# Patient Record
Sex: Female | Born: 1965 | Race: White | Hispanic: No | Marital: Married | Smoking: Current every day smoker
Health system: Southern US, Community
[De-identification: ages and names within clinical notes are randomized; demographics above are authoritative.]

## PROBLEM LIST (undated history)

## (undated) DIAGNOSIS — J9621 Acute and chronic respiratory failure with hypoxia: Secondary | ICD-10-CM

## (undated) DIAGNOSIS — K219 Gastro-esophageal reflux disease without esophagitis: Secondary | ICD-10-CM

## (undated) DIAGNOSIS — J449 Chronic obstructive pulmonary disease, unspecified: Secondary | ICD-10-CM

## (undated) DIAGNOSIS — I639 Cerebral infarction, unspecified: Secondary | ICD-10-CM

## (undated) HISTORY — DX: Chronic obstructive pulmonary disease, unspecified: J44.9

## (undated) HISTORY — DX: Gastro-esophageal reflux disease without esophagitis: K21.9

## (undated) HISTORY — PX: BREAST SURGERY: SHX581

---

## 2012-11-22 ENCOUNTER — Encounter: Payer: Self-pay | Admitting: Sports Medicine

## 2012-11-22 ENCOUNTER — Ambulatory Visit (INDEPENDENT_AMBULATORY_CARE_PROVIDER_SITE_OTHER): Payer: Self-pay | Admitting: Sports Medicine

## 2012-11-22 VITALS — BP 130/85 | HR 111 | Wt 315.0 lb

## 2012-11-22 DIAGNOSIS — Z299 Encounter for prophylactic measures, unspecified: Secondary | ICD-10-CM | POA: Insufficient documentation

## 2012-11-22 DIAGNOSIS — I878 Other specified disorders of veins: Secondary | ICD-10-CM | POA: Insufficient documentation

## 2012-11-22 DIAGNOSIS — E669 Obesity, unspecified: Secondary | ICD-10-CM | POA: Insufficient documentation

## 2012-11-22 DIAGNOSIS — R32 Unspecified urinary incontinence: Secondary | ICD-10-CM

## 2012-11-22 DIAGNOSIS — F419 Anxiety disorder, unspecified: Secondary | ICD-10-CM | POA: Insufficient documentation

## 2012-11-22 DIAGNOSIS — J4489 Other specified chronic obstructive pulmonary disease: Secondary | ICD-10-CM

## 2012-11-22 DIAGNOSIS — I872 Venous insufficiency (chronic) (peripheral): Secondary | ICD-10-CM

## 2012-11-22 DIAGNOSIS — F411 Generalized anxiety disorder: Secondary | ICD-10-CM

## 2012-11-22 DIAGNOSIS — J449 Chronic obstructive pulmonary disease, unspecified: Secondary | ICD-10-CM | POA: Insufficient documentation

## 2012-11-22 DIAGNOSIS — F17200 Nicotine dependence, unspecified, uncomplicated: Secondary | ICD-10-CM

## 2012-11-22 MED ORDER — LORAZEPAM 0.5 MG PO TABS: 0.5000 mg | ORAL_TABLET | Freq: Three times a day (TID) | ORAL | Status: DC | PRN

## 2012-11-22 MED ORDER — CITALOPRAM HYDROBROMIDE 20 MG PO TABS: 20.0000 mg | ORAL_TABLET | Freq: Every day | ORAL | Status: DC

## 2012-11-22 MED ORDER — AMBULATORY NON FORMULARY MEDICATION: Status: DC

## 2012-11-22 NOTE — Assessment & Plan Note (Signed)
We will start citalopram, and continue Ativan for breakthrough anxiety episodes. I will see her back in 2 weeks. GAD 7 and PHQ9 before visit.

## 2012-11-22 NOTE — Assessment & Plan Note (Signed)
Up-to-date on Pap smear. We will need to immunize with Tdap and flu at next visit.

## 2012-11-22 NOTE — Assessment & Plan Note (Signed)
We will follow this up at a future visit. 

## 2012-11-22 NOTE — Assessment & Plan Note (Signed)
We will follow this up at a future visit.

## 2012-11-22 NOTE — Progress Notes (Addendum)
Subjective:    CC: Establish care.   HPI:  Anxiety: Wakes up often in pack, notes little interest or pleasure in doing things, difficulty sleeping, poor energy, change in appetite, and trouble concentrating. Denies suicidal or homicidal ideation. Also notes feeling nervous, excessive worry, trouble relaxing, restlessness, irritability, and fear. Has been on Zoloft and Ativan in the past with good response.  Lower extremity swelling: Is present in both extremities for years, painful, pain is localized to entirety of both lower extremities, no radiation, moderate in severity, swollen, no long car rides, surgery, or other DVT risks. No shortness of breath.  COPD: Stable, uses inhalers. No changes.  Urinary incontinence: Stable, amenable to discussing this at a future visit.  Smoker: Smokes 2 packs a day.  Preventive measure: Last Pap was in July of 2013. She has an OB/GYN.  Past medical history, Surgical history, Family history, Social history, Allergies, and medications have been entered into the medical record, reviewed, and no changes needed.   Review of Systems: No headache, visual changes, nausea, vomiting, diarrhea, constipation, dizziness, abdominal pain, skin rash, fevers, chills, night sweats, swollen lymph nodes, weight loss, chest pain, body aches, joint swelling, muscle aches, shortness of breath, mood changes, visual or auditory hallucinations.  Objective:    General: Well Developed, obese, and in no acute distress.  Neuro: Alert and oriented x3, extra-ocular muscles intact.  HEENT: Normocephalic, atraumatic, pupils equal round reactive to light, neck supple, no masses, no lymphadenopathy, thyroid nonpalpable.  Skin: Warm and dry, no rashes noted.  Cardiac: Regular rate and rhythm, no murmurs rubs or gallops.  Respiratory: Clear to auscultation bilaterally. Not using accessory muscles, speaking in full sentences.  Abdominal: Soft, nontender, nondistended, positive bowel sounds,  no masses, no organomegaly.  Musculoskeletal: Shoulder, elbow, wrist, hip, knee, ankle stable, and with full range of motion. Both legs show signs of venous stasis including hemosiderin deposits. Negative Homans sign bilaterally.  Impression and Recommendations:    The patient was counselled, risk factors were discussed, anticipatory guidance given.

## 2012-11-22 NOTE — Assessment & Plan Note (Signed)
We will try graduated compression stockings. I will put her out a prescription, and she will take this to her nearest medical supply store.

## 2012-12-01 ENCOUNTER — Other Ambulatory Visit: Payer: Self-pay | Admitting: *Deleted

## 2012-12-01 MED ORDER — ESOMEPRAZOLE MAGNESIUM 40 MG PO CPDR: 40.0000 mg | DELAYED_RELEASE_CAPSULE | Freq: Every day | ORAL | Status: AC

## 2012-12-02 ENCOUNTER — Encounter: Payer: Self-pay | Admitting: Sports Medicine

## 2012-12-02 DIAGNOSIS — K219 Gastro-esophageal reflux disease without esophagitis: Secondary | ICD-10-CM | POA: Insufficient documentation

## 2012-12-08 ENCOUNTER — Ambulatory Visit: Payer: 59 | Admitting: Sports Medicine

## 2012-12-13 ENCOUNTER — Other Ambulatory Visit: Payer: Self-pay | Admitting: *Deleted

## 2012-12-13 NOTE — Telephone Encounter (Signed)
Kristin Sampson was supposed to come back and see me in 2 weeks to see how she was doing on the new medicine. It has been 3 weeks. Have her come back to see me and we can refill it at that time.

## 2012-12-13 NOTE — Telephone Encounter (Signed)
Pt calls & wants to know if you will refill her ativan prescription?

## 2012-12-14 NOTE — Telephone Encounter (Signed)
Pt.notified

## 2012-12-16 ENCOUNTER — Ambulatory Visit: Payer: 59 | Admitting: Sports Medicine

## 2013-01-23 ENCOUNTER — Ambulatory Visit (INDEPENDENT_AMBULATORY_CARE_PROVIDER_SITE_OTHER): Payer: 59 | Admitting: Sports Medicine

## 2013-01-23 ENCOUNTER — Ambulatory Visit: Payer: 59 | Admitting: Sports Medicine

## 2013-01-23 ENCOUNTER — Encounter: Payer: Self-pay | Admitting: Sports Medicine

## 2013-01-23 VITALS — BP 137/92 | HR 95 | Wt 304.0 lb

## 2013-01-23 DIAGNOSIS — R0601 Orthopnea: Secondary | ICD-10-CM | POA: Insufficient documentation

## 2013-01-23 DIAGNOSIS — G4733 Obstructive sleep apnea (adult) (pediatric): Secondary | ICD-10-CM

## 2013-01-23 DIAGNOSIS — J4489 Other specified chronic obstructive pulmonary disease: Secondary | ICD-10-CM

## 2013-01-23 DIAGNOSIS — F17200 Nicotine dependence, unspecified, uncomplicated: Secondary | ICD-10-CM

## 2013-01-23 DIAGNOSIS — J449 Chronic obstructive pulmonary disease, unspecified: Secondary | ICD-10-CM

## 2013-01-23 MED ORDER — TIOTROPIUM BROMIDE MONOHYDRATE 18 MCG IN CAPS: 18.0000 ug | ORAL_CAPSULE | Freq: Every day | RESPIRATORY_TRACT | Status: AC

## 2013-01-23 NOTE — Assessment & Plan Note (Signed)
Precontemplative phase, will revisit in the future.

## 2013-01-23 NOTE — Assessment & Plan Note (Signed)
With paroxysmal nocturnal dyspnea. I'm going to obtain an echo.

## 2013-01-23 NOTE — Assessment & Plan Note (Addendum)
Currently using Symbicort. I'm going to add Spiriva. Smoking cessation emphasized.

## 2013-01-23 NOTE — Progress Notes (Signed)
Subjective:    CC: Followup  HPI: COPD: Diagnosed years ago with spirometry, and a chest x-ray. She was started on Symbicort, and breathing has been pretty good without exacerbations. Unfortunately she still notes some shortness of breath is wondering what else can be done. She does still smoke, but notes that she is not yet ready to quit. She does note difficulty particularly at night, and does have some orthopnea and paroxysmal nocturnal dyspnea. She also has mild swelling in her lower extremities.  Past medical history, Surgical history, Family history not pertinant except as noted below, Social history, Allergies, and medications have been entered into the medical record, reviewed, and no changes needed.   Review of Systems: No fevers, chills, night sweats, weight loss, chest pain, or shortness of breath.   Objective:    General: Well Developed, well nourished, and in no acute distress.  Neuro: Alert and oriented x3, extra-ocular muscles intact, sensation grossly intact.  HEENT: Normocephalic, atraumatic, pupils equal round reactive to light, neck supple, no masses, no lymphadenopathy, thyroid nonpalpable.  Skin: Warm and dry, no rashes. Cardiac: Regular rate and rhythm, no murmurs rubs or gallops. 1+ pitting edema in both lower extremities, venous stasis is also present by the way. Respiratory: Clear to auscultation bilaterally. Not using accessory muscles, speaking in full sentences. Impression and Recommendations:

## 2013-02-09 ENCOUNTER — Ambulatory Visit (HOSPITAL_COMMUNITY): Payer: 59 | Attending: Sports Medicine | Admitting: Radiology

## 2013-02-09 ENCOUNTER — Other Ambulatory Visit (HOSPITAL_COMMUNITY): Payer: Self-pay | Admitting: Sports Medicine

## 2013-02-09 ENCOUNTER — Other Ambulatory Visit (HOSPITAL_COMMUNITY): Payer: Self-pay | Admitting: Radiology

## 2013-02-09 ENCOUNTER — Other Ambulatory Visit: Payer: Self-pay

## 2013-02-09 DIAGNOSIS — J449 Chronic obstructive pulmonary disease, unspecified: Secondary | ICD-10-CM | POA: Insufficient documentation

## 2013-02-09 DIAGNOSIS — F17200 Nicotine dependence, unspecified, uncomplicated: Secondary | ICD-10-CM | POA: Insufficient documentation

## 2013-02-09 DIAGNOSIS — R0601 Orthopnea: Secondary | ICD-10-CM

## 2013-02-09 DIAGNOSIS — G4733 Obstructive sleep apnea (adult) (pediatric): Secondary | ICD-10-CM | POA: Insufficient documentation

## 2013-02-09 DIAGNOSIS — J4489 Other specified chronic obstructive pulmonary disease: Secondary | ICD-10-CM | POA: Insufficient documentation

## 2013-02-09 NOTE — Progress Notes (Signed)
Echocardiogram performed.  

## 2013-03-27 ENCOUNTER — Ambulatory Visit: Payer: 59 | Admitting: Sports Medicine

## 2013-03-29 ENCOUNTER — Ambulatory Visit (INDEPENDENT_AMBULATORY_CARE_PROVIDER_SITE_OTHER): Payer: 59 | Admitting: Sports Medicine

## 2013-03-29 ENCOUNTER — Encounter: Payer: Self-pay | Admitting: Sports Medicine

## 2013-03-29 ENCOUNTER — Other Ambulatory Visit: Payer: Self-pay | Admitting: Sports Medicine

## 2013-03-29 VITALS — BP 139/87 | HR 96 | Wt 293.0 lb

## 2013-03-29 DIAGNOSIS — J4489 Other specified chronic obstructive pulmonary disease: Secondary | ICD-10-CM

## 2013-03-29 DIAGNOSIS — J449 Chronic obstructive pulmonary disease, unspecified: Secondary | ICD-10-CM

## 2013-03-29 DIAGNOSIS — R252 Cramp and spasm: Secondary | ICD-10-CM

## 2013-03-29 DIAGNOSIS — F17200 Nicotine dependence, unspecified, uncomplicated: Secondary | ICD-10-CM

## 2013-03-29 DIAGNOSIS — Z299 Encounter for prophylactic measures, unspecified: Secondary | ICD-10-CM

## 2013-03-29 DIAGNOSIS — Z0289 Encounter for other administrative examinations: Secondary | ICD-10-CM

## 2013-03-29 MED ORDER — BUPROPION HCL ER (XL) 150 MG PO TB24: 150.0000 mg | ORAL_TABLET | ORAL | Status: AC

## 2013-03-29 MED ORDER — MAGNESIUM OXIDE 400 MG PO TABS: 800.0000 mg | ORAL_TABLET | Freq: Every day | ORAL | Status: AC

## 2013-03-29 NOTE — Progress Notes (Signed)
  Subjective:    CC: Followup  HPI: Smoker: Naika is now ready to try quitting. She does not want to try Chantix.  COPD: Stable, breathing is still not where she wants it to be but she understands she needs to quit smoking. No issues with Symbicort and Spiriva.  Muscle cramps: Present in the hands and the legs, present for a few weeks now, no changes in activity, medications, diet. No back pain. Worse at night. No numbness or tingling, or pain in the neck.  Past medical history, Surgical history, Family history not pertinant except as noted below, Social history, Allergies, and medications have been entered into the medical record, reviewed, and no changes needed.   Review of Systems: No fevers, chills, night sweats, weight loss, chest pain, or shortness of breath.   Objective:    General: Well Developed, well nourished, and in no acute distress.  Neuro: Alert and oriented x3, extra-ocular muscles intact, sensation grossly intact.  HEENT: Normocephalic, atraumatic, pupils equal round reactive to light, neck supple, no masses, no lymphadenopathy, thyroid nonpalpable.  Skin: Warm and dry, no rashes. Cardiac: Regular rate and rhythm, no murmurs rubs or gallops, no lower extremity edema.  Respiratory: Clear to auscultation bilaterally. Not using accessory muscles, speaking in full sentences. Extremities: Unremarkable to inspection, unremarkable to palpation, neurovascularly intact distally, full range of motion throughout.  Impression and Recommendations:

## 2013-03-29 NOTE — Assessment & Plan Note (Signed)
Symptoms are not bad with Symbicort and Spiriva. We again discussed smoking cessation.

## 2013-03-29 NOTE — Assessment & Plan Note (Signed)
In both hands and both feet. Checking electrolytes, adding magnesium 800 mg each bedtime.

## 2013-03-29 NOTE — Assessment & Plan Note (Signed)
Checking routine bloodwork. 

## 2013-03-29 NOTE — Assessment & Plan Note (Signed)
Discussed smoking cessation again. She is now ready to quit. She declines Chantix, we will try bupropion. She is also willing to try to vapor cigarette.

## 2013-03-29 NOTE — Progress Notes (Signed)
Disability forms filled out, charge sheet entered.

## 2013-03-30 ENCOUNTER — Ambulatory Visit: Payer: 59 | Admitting: Sports Medicine

## 2013-03-31 ENCOUNTER — Telehealth: Payer: Self-pay | Admitting: *Deleted

## 2013-03-31 NOTE — Telephone Encounter (Signed)
Pt has LM that the Magnesium is not working and that you told her that if it didn't help to call back. Please advise.

## 2013-03-31 NOTE — Telephone Encounter (Signed)
Pt says no thanks that she has taken vistaril before and it didn't work.

## 2013-03-31 NOTE — Telephone Encounter (Signed)
I would be happy to talk to her in an office visit to discuss possible causes and remedies for insomnia.

## 2013-03-31 NOTE — Telephone Encounter (Signed)
LM on VM for pt to return call. 

## 2013-03-31 NOTE — Telephone Encounter (Signed)
I am happy to do Vistaril which is just as effective but not habit-forming.

## 2013-03-31 NOTE — Telephone Encounter (Signed)
Increase magnesium to 1200 mg at night, double water consumption.

## 2013-03-31 NOTE — Telephone Encounter (Signed)
Informed pt about your response to the magnesium. She is asking now if you will call her in a prescription for Ambien. She states she is not sleeping at night.

## 2013-04-03 NOTE — Telephone Encounter (Signed)
Informed pt and she states she doesn't want to set up an appt at this time.

## 2013-04-07 ENCOUNTER — Ambulatory Visit: Payer: 59 | Admitting: Sports Medicine

## 2013-04-10 ENCOUNTER — Encounter: Payer: Self-pay | Admitting: Sports Medicine

## 2013-04-10 ENCOUNTER — Ambulatory Visit (INDEPENDENT_AMBULATORY_CARE_PROVIDER_SITE_OTHER): Payer: 59 | Admitting: Sports Medicine

## 2013-04-10 VITALS — BP 153/90 | HR 108 | Wt 289.0 lb

## 2013-04-10 DIAGNOSIS — L600 Ingrowing nail: Secondary | ICD-10-CM | POA: Insufficient documentation

## 2013-04-10 DIAGNOSIS — B353 Tinea pedis: Secondary | ICD-10-CM

## 2013-04-10 DIAGNOSIS — R252 Cramp and spasm: Secondary | ICD-10-CM

## 2013-04-10 MED ORDER — CYCLOBENZAPRINE HCL 10 MG PO TABS: ORAL_TABLET | ORAL | Status: DC

## 2013-04-10 MED ORDER — METHOCARBAMOL 500 MG PO TABS: 500.0000 mg | ORAL_TABLET | Freq: Three times a day (TID) | ORAL | Status: AC

## 2013-04-10 MED ORDER — TERBINAFINE HCL 250 MG PO TABS: 250.0000 mg | ORAL_TABLET | Freq: Every day | ORAL | Status: AC

## 2013-04-10 NOTE — Assessment & Plan Note (Signed)
Patient will schedule excision.

## 2013-04-10 NOTE — Assessment & Plan Note (Addendum)
Continue magnesium at bedtime. Adding Robaxin. She did ask for soma, which was the denied.

## 2013-04-10 NOTE — Progress Notes (Signed)
  Subjective:    CC: Foot pain  HPI: Kristin Sampson comes back with complaints of bilateral foot pain. This is present chronically, and she has ingrown toenails on both sides of the great toes, as well as cracked heels. Pain is localized, does not radiate, mild to moderate. Stable.   Past medical history, Surgical history, Family history not pertinant except as noted below, Social history, Allergies, and medications have been entered into the medical record, reviewed, and no changes needed.   Review of Systems: No fevers, chills, night sweats, weight loss, chest pain, or shortness of breath.   Objective:    General: Well Developed, well nourished, and in no acute distress.  Neuro: Alert and oriented x3, extra-ocular muscles intact, sensation grossly intact.  HEENT: Normocephalic, atraumatic, pupils equal round reactive to light, neck supple, no masses, no lymphadenopathy, thyroid nonpalpable.  Skin: Warm and dry, no rashes.Heels are cracked and scaly, both toenails on her great toes are ingrown.  Cardiac: Regular rate and rhythm, no murmurs rubs or gallops, no lower extremity edema.  Respiratory: Clear to auscultation bilaterally. Not using accessory muscles, speaking in full sentences. Impression and Recommendations:

## 2013-04-10 NOTE — Assessment & Plan Note (Signed)
Lamisil for 4 weeks.

## 2013-04-13 ENCOUNTER — Ambulatory Visit (INDEPENDENT_AMBULATORY_CARE_PROVIDER_SITE_OTHER): Payer: 59 | Admitting: Sports Medicine

## 2013-04-13 DIAGNOSIS — L600 Ingrowing nail: Secondary | ICD-10-CM

## 2013-04-13 MED ORDER — OXYCODONE-ACETAMINOPHEN 5-325 MG PO TABS: 1.0000 | ORAL_TABLET | Freq: Three times a day (TID) | ORAL | Status: DC | PRN

## 2013-04-13 NOTE — Assessment & Plan Note (Signed)
Left and right great toenails were removed today under digital block. Oxycodone. Return in 1 weeks.

## 2013-04-13 NOTE — Progress Notes (Signed)
   Procedure:  Removal of left and right great toenails due to onychomycosis and onycholysis.  Risks, benefits, alternatives explained to patient. Consent obtained. Time out conducted. Noted no overlying induration or erythema at site of injection. Toe cleaned with alcohol, then a total of 7 cc lidocaine 2% infiltrated at adjacent webspaces at the location of the bifurcation of the common digital nerve to proper digital nerves, as well as over the dorsum of the great toe.  Some lidocaine also infiltrated at hyponychium and under nail bed.  Adequate anesthesia ensured. Toe prepped and draped in a sterile fashion. Nail elevator used to separate nail plate from nail bed. Clippers used to cut toenail in a longitudinal fashion to proximal nail fold and matrix. Hemostat then used to separate nail fragment from surrounding structures. Nail bed and matrix treated. Minor bleeding controlled with pressure and silver nitrate. Antibiotic ointment applied. Toe dressed. Advised to return if increased redness, swelling, drainage, fevers, or chills The procedure was repeated on the contralateral side.

## 2013-04-13 NOTE — Patient Instructions (Addendum)
Toenail Removal   Toenails may need to be removed because of injury, infections, or to correct abnormal growth. A special non-stick bandage will likely be put tightly on your toe to prevent bleeding. Often times a new nail will grow back. Sometimes the new nail may be deformed. Most of the time when a nail is lost, it will gradually heal, but may be sensitive for a long time.   HOME CARE INSTRUCTIONS   Keep your foot elevated to relieve pain and swelling. This will require lying in bed or on a couch with the leg on pillows or sitting in a recliner with the leg up. Walking or letting your leg dangle may increase swelling, slow healing, and cause throbbing pain.   Keep your bandage dry and clean.   Change your bandage in 24 hours.   After your bandage is changed, soak your foot in warm, soapy water for 10 to 20 minutes. Do this 3 times per day. This helps reduce pain and swelling. After soaking your foot apply a clean, dry bandage. Change your bandage if it is wet or dirty.   Only take over-the-counter or prescription medicines for pain, discomfort, or fever as directed by your caregiver.   See your caregiver as needed for problems.  You might need a tetanus shot now if:   You have no idea when you had the last one.   You have never had a tetanus shot before.   The injured area had dirt in it.  If you need a tetanus shot, and you decide not to get one, there is a rare chance of getting tetanus. Sickness from tetanus can be serious. If you did get a tetanus shot, your arm may swell, get red and warm to the touch at the shot site. This is common and not a problem.   SEEK IMMEDIATE MEDICAL CARE IF:   You have increased pain, swelling, redness, warmth, drainage, or bleeding.   You have a fever.   You have swelling that spreads from your toe into your foot.  Document Released: 08/08/2003 Document Revised: 02/01/2012 Document Reviewed: 11/19/2008   [employer]® Patient Information ©2014 [employer].

## 2013-04-19 ENCOUNTER — Telehealth: Payer: Self-pay | Admitting: Sports Medicine

## 2013-04-19 NOTE — Telephone Encounter (Signed)
Patient called and stated she request a call back about some concerns and questions she has. Thanks [phone]

## 2013-04-19 NOTE — Telephone Encounter (Signed)
Noted, will see her tomorrow.

## 2013-04-19 NOTE — Telephone Encounter (Signed)
Pt states that her toe is infected from the toenail removal. Informed pt to schedule appt for Thursday. She was also asking if Dr. Benjamin Stain would refill her pain med. Pt transferred to front to schedule appt.  Meyer Cory, LPN

## 2013-04-20 ENCOUNTER — Ambulatory Visit (INDEPENDENT_AMBULATORY_CARE_PROVIDER_SITE_OTHER): Payer: 59 | Admitting: Sports Medicine

## 2013-04-20 ENCOUNTER — Encounter: Payer: Self-pay | Admitting: Sports Medicine

## 2013-04-20 VITALS — BP 121/75 | HR 102 | Temp 97.6°F | Wt 291.0 lb

## 2013-04-20 DIAGNOSIS — L600 Ingrowing nail: Secondary | ICD-10-CM

## 2013-04-20 MED ORDER — KETOROLAC TROMETHAMINE 10 MG PO TABS: 10.0000 mg | ORAL_TABLET | Freq: Four times a day (QID) | ORAL | Status: AC | PRN

## 2013-04-20 MED ORDER — KETOROLAC TROMETHAMINE 60 MG/2ML IM SOLN
60.0000 mg | Freq: Once | INTRAMUSCULAR | Status: AC
  Administered 2013-04-20: 60 mg via INTRAMUSCULAR

## 2013-04-20 MED ORDER — CEPHALEXIN 500 MG PO CAPS: 500.0000 mg | ORAL_CAPSULE | Freq: Four times a day (QID) | ORAL | Status: AC

## 2013-04-20 NOTE — Assessment & Plan Note (Signed)
Looks infected. Adding cephalexin. Toradol 60mg  IM. Toradol 10mg  TID x 5 days.

## 2013-04-20 NOTE — Progress Notes (Signed)
  Subjective:    CC: Postop  HPI: I removed Kristin Sampson's great toenails one week ago on both sides. Unfortunately she's having increasing pain with some mild drainage, but no constitutional symptoms. She change the dressings daily if she can.  Past medical history, Surgical history, Family history not pertinant except as noted below, Social history, Allergies, and medications have been entered into the medical record, reviewed, and no changes needed.   Review of Systems: No fevers, chills, night sweats, weight loss, chest pain, or shortness of breath.   Objective:    General: Well Developed, well nourished, and in no acute distress.  Feet bilateral: There is some erythema of the toes bilaterally as well as a purulent, adherent, exudate over the nailbed.  She is neurovascularly intact distally, but is exquisitely painful.  Toradol 60 mg intramuscular given in office.  Impression and Recommendations:

## 2013-04-25 ENCOUNTER — Ambulatory Visit: Payer: 59 | Admitting: Sports Medicine

## 2013-04-25 DIAGNOSIS — Z0289 Encounter for other administrative examinations: Secondary | ICD-10-CM

## 2013-04-26 ENCOUNTER — Ambulatory Visit: Payer: 59 | Admitting: Sports Medicine

## 2020-08-26 ENCOUNTER — Other Ambulatory Visit (HOSPITAL_COMMUNITY): Payer: Medicare HMO

## 2020-08-26 ENCOUNTER — Inpatient Hospital Stay
Admit: 2020-08-26 | Discharge: 2020-09-12 | Disposition: A | Discharge status: Skilled Nursing Facility | Payer: Medicare HMO | Source: Other Acute Inpatient Hospital | Attending: Internal Medicine | Admitting: Internal Medicine

## 2020-08-26 DIAGNOSIS — I639 Cerebral infarction, unspecified: Secondary | ICD-10-CM | POA: Diagnosis present

## 2020-08-26 DIAGNOSIS — J449 Chronic obstructive pulmonary disease, unspecified: Secondary | ICD-10-CM | POA: Diagnosis present

## 2020-08-26 DIAGNOSIS — J9621 Acute and chronic respiratory failure with hypoxia: Secondary | ICD-10-CM | POA: Diagnosis present

## 2020-08-26 DIAGNOSIS — Z931 Gastrostomy status: Secondary | ICD-10-CM

## 2020-08-26 DIAGNOSIS — J9690 Respiratory failure, unspecified, unspecified whether with hypoxia or hypercapnia: Secondary | ICD-10-CM

## 2020-08-26 DIAGNOSIS — J189 Pneumonia, unspecified organism: Secondary | ICD-10-CM

## 2020-08-26 HISTORY — DX: Morbid (severe) obesity due to excess calories: E66.01

## 2020-08-26 HISTORY — DX: Acute and chronic respiratory failure with hypoxia: J96.21

## 2020-08-26 HISTORY — DX: Chronic obstructive pulmonary disease, unspecified: J44.9

## 2020-08-26 HISTORY — DX: Cerebral infarction, unspecified: I63.9

## 2020-08-26 LAB — BLOOD GAS, ARTERIAL
Acid-Base Excess: 1.9 mmol/L (ref 0.0–2.0)
Bicarbonate: 25.4 mmol/L (ref 20.0–28.0)
FIO2: 50
O2 Saturation: 99.1 %
Patient temperature: 37
pCO2 arterial: 36 mmHg (ref 32.0–48.0)
pH, Arterial: 7.462 — ABNORMAL HIGH (ref 7.350–7.450)
pO2, Arterial: 141 mmHg — ABNORMAL HIGH (ref 83.0–108.0)

## 2020-08-27 ENCOUNTER — Other Ambulatory Visit (HOSPITAL_COMMUNITY): Payer: Medicare HMO

## 2020-08-27 LAB — COMPREHENSIVE METABOLIC PANEL
ALT: 23 U/L (ref 0–44)
AST: 20 U/L (ref 15–41)
Albumin: 2.9 g/dL — ABNORMAL LOW (ref 3.5–5.0)
Alkaline Phosphatase: 81 U/L (ref 38–126)
Anion gap: 12 (ref 5–15)
BUN: 21 mg/dL — ABNORMAL HIGH (ref 6–20)
CO2: 24 mmol/L (ref 22–32)
Calcium: 9.4 mg/dL (ref 8.9–10.3)
Chloride: 99 mmol/L (ref 98–111)
Creatinine, Ser: 0.46 mg/dL (ref 0.44–1.00)
GFR calc Af Amer: >60 mL/min (ref >60)
GFR calc non Af Amer: >60 mL/min (ref >60)
Glucose, Bld: 158 mg/dL — ABNORMAL HIGH (ref 70–99)
Potassium: 4.2 mmol/L (ref 3.5–5.1)
Sodium: 135 mmol/L (ref 135–145)
Total Bilirubin: 0.8 mg/dL (ref 0.3–1.2)
Total Protein: 7.9 g/dL (ref 6.5–8.1)

## 2020-08-27 LAB — CBC WITH DIFFERENTIAL/PLATELET
Abs Immature Granulocytes: 0.05 10*3/uL (ref 0.00–0.07)
Basophils Absolute: 0.1 10*3/uL (ref 0.0–0.1)
Basophils Relative: 0 %
Eosinophils Absolute: 0.3 10*3/uL (ref 0.0–0.5)
Eosinophils Relative: 2 %
HCT: 38.6 % (ref 36.0–46.0)
Hemoglobin: 11.9 g/dL — ABNORMAL LOW (ref 12.0–15.0)
Immature Granulocytes: 0 %
Lymphocytes Relative: 24 %
Lymphs Abs: 3.2 10*3/uL (ref 0.7–4.0)
MCH: 26.6 pg (ref 26.0–34.0)
MCHC: 30.8 g/dL (ref 30.0–36.0)
MCV: 86.2 fL (ref 80.0–100.0)
Monocytes Absolute: 0.8 10*3/uL (ref 0.1–1.0)
Monocytes Relative: 6 %
Neutro Abs: 9 10*3/uL — ABNORMAL HIGH (ref 1.7–7.7)
Neutrophils Relative %: 68 %
Platelets: 384 10*3/uL (ref 150–400)
RBC: 4.48 MIL/uL (ref 3.87–5.11)
RDW: 16.7 % — ABNORMAL HIGH (ref 11.5–15.5)
WBC: 13.3 10*3/uL — ABNORMAL HIGH (ref 4.0–10.5)
nRBC: 0 % (ref 0.0–0.2)

## 2020-08-27 LAB — PHOSPHORUS: Phosphorus: 4.4 mg/dL (ref 2.5–4.6)

## 2020-08-27 LAB — MAGNESIUM: Magnesium: 1.9 mg/dL (ref 1.7–2.4)

## 2020-08-27 LAB — PROTIME-INR
INR: 1.1 (ref 0.8–1.2)
Prothrombin Time: 13.3 seconds (ref 11.4–15.2)

## 2020-08-27 LAB — HEMOGLOBIN A1C
Hgb A1c MFr Bld: 6.4 % — ABNORMAL HIGH (ref 4.8–5.6)
Mean Plasma Glucose: 136.98 mg/dL

## 2020-08-28 ENCOUNTER — Encounter: Payer: Self-pay | Admitting: Internal Medicine

## 2020-08-28 DIAGNOSIS — J9621 Acute and chronic respiratory failure with hypoxia: Secondary | ICD-10-CM | POA: Diagnosis present

## 2020-08-28 DIAGNOSIS — J449 Chronic obstructive pulmonary disease, unspecified: Secondary | ICD-10-CM

## 2020-08-28 DIAGNOSIS — I639 Cerebral infarction, unspecified: Secondary | ICD-10-CM | POA: Diagnosis not present

## 2020-08-28 NOTE — Consult Note (Signed)
Pulmonary Critical Care Medicine [facility] [place]  PULMONARY SERVICE  Date of Service: 08/28/2020  PULMONARY CRITICAL CARE CONSULT   Kristin Sampson  YIF:[id number]  DOB: [birth year]   DOA: 08/26/2020  Referring Physician: Carron Curie, MD  HPI: Kristin Sampson is a 54 y.o. female seen for follow up of Acute on Chronic Respiratory Failure.  Patient is transferred to our facility for further management and weaning.  She presented to the hospital for a colostomy reversal.  Patient apparently underwent a colonoscopy but was unable to complete secondary to looping.  Patient was therefore admitted for a CT colonography prior to reversal.  Patient has past medical history of bipolar disorder along with obesity she has a history of necrotizing soft tissue infections ESBL bacteremia and at that time she had a diagnostic laparoscopic done with a diverting colostomy having been prepped placed.  She came back in for reversal however was not able to be done.  Patient was taken to the OR post procedure was admitted to the ICU after colostomy reversal have been done.  Patient apparently developed altered mental status with new neurological deficits.  Neurology saw the patient and she was found to have a left acute stroke.  Patient was treated medically stabilized however she has had some issues weaning and so therefore had to have a tracheostomy and a PEG placed.  Transferred to our facility for further management  Review of Systems:  ROS performed and is unremarkable other than noted above.  Past Medical History:  Diagnosis Date  . COPD (chronic obstructive pulmonary disease)   . GERD (gastroesophageal reflux disease)   Anxiety   . Arthritis   . Bipolar disorder (*)   . COPD (chronic obstructive pulmonary disease) (*)   . Depression   . Diabetes mellitus (*)   . Diabetes mellitus, type 2 (*)   . Hypertension   . Necrotizing fasciitis (*) 08/30/2019  . Opiate addiction (*)    H/O  dependency.  . OSA (obstructive sleep apnea)   . Panic attacks   . Shortness of breath on exertion   . Tobacco dependence     Past Surgical History:  Procedure Laterality Date  . BREAST SURGERY    Ablation saphenous vein w/ rfa Right 11/24/2016   RFA Right [facility]/Dr.Rickey  . Ablation saphenous vein w/ rfa Left 12/22/2016   RFA Left [facility]/Dr.Rickey  . Breast surgery     Breast lift and augmentation  . Colon surgery    . Dilation and curettage of uterus      Social History:    reports that she has been smoking. She does not have any smokeless tobacco history on file. She reports that she does not drink alcohol and does not use drugs.  Family History: Non-Contributory to the present illness  Allergies  Allergen Reactions  . Hydrocodeine [Dihydrocodeine] Itching    Medications: Reviewed on Rounds  Physical Exam:  Vitals: Temperature 97.2 pulse 108 respiratory 18 blood pressure is 142/75 saturations 98%  Ventilator Settings on pressure support FiO2 28% pressure 12/5  . General: Comfortable at this time . Eyes: Grossly normal lids, irises & conjunctiva . ENT: grossly tongue is normal . Neck: no obvious mass . Cardiovascular: S1-S2 normal no gallop or rub . Respiratory: Scattered rhonchi expansion is equal . Abdomen: Soft and nontender . Skin: no rash seen on limited exam . Musculoskeletal: not rigid . Psychiatric:unable to assess . Neurologic: no seizure no involuntary movements  Labs on Admission:  Basic Metabolic Panel: Recent Labs  Lab 08/27/20 0618  NA 135  K 4.2  CL 99  CO2 24  GLUCOSE 158*  BUN 21*  CREATININE 0.46  CALCIUM 9.4  MG 1.9  PHOS 4.4    Recent Labs  Lab 08/26/20 1858  PHART 7.462*  PCO2ART 36.0  PO2ART 141*  HCO3 25.4  O2SAT 99.1    Liver Function Tests: Recent Labs  Lab 08/27/20 0618  AST 20  ALT 23  ALKPHOS 81  BILITOT 0.8  PROT 7.9  ALBUMIN 2.9*   No results for input(s): LIPASE, AMYLASE in the  last 168 hours. No results for input(s): AMMONIA in the last 168 hours.  CBC: Recent Labs  Lab 08/27/20 0618  WBC 13.3*  NEUTROABS 9.0*  HGB 11.9*  HCT 38.6  MCV 86.2  PLT 384    Cardiac Enzymes: No results for input(s): CKTOTAL, CKMB, CKMBINDEX, TROPONINI in the last 168 hours.  BNP (last 3 results) No results for input(s): BNP in the last 8760 hours.  ProBNP (last 3 results) No results for input(s): PROBNP in the last 8760 hours.   Radiological Exams on Admission: DG Chest Port 1 View  Result Date: 08/27/2020 CLINICAL DATA:  Respiratory failure EXAM: PORTABLE CHEST 1 VIEW COMPARISON:  None. FINDINGS: Tracheostomy tube in unremarkable position. Right PICC with tip at the SVC. Low volume chest with indistinct opacity at the bases. No edema, effusion, or pneumothorax. IMPRESSION: 1. Low volume chest with atelectasis or infiltrate at the bases. 2. Unremarkable hardware Electronically Signed   By: Marnee Spring M.D.   On: 08/27/2020 06:40   DG Abd Portable 1V  Result Date: 08/26/2020 CLINICAL DATA:  Percutaneous gastrostomy tube verification EXAM: PORTABLE ABDOMEN - 1 VIEW COMPARISON:  None. FINDINGS: Injection of contrast through the pre-existing gastrostomy tube opacifies the stomach. No extraluminal contrast was noted. The bowel gas pattern is nonobstructive. IMPRESSION: Gastrostomy tube appears grossly well positioned without evidence for extraluminal contrast. Electronically Signed   By: Katherine Mantle M.D.   On: 08/26/2020 19:10    Assessment/Plan Active Problems:   Acute on chronic respiratory failure with hypoxia (HCC)   Acute stroke due to ischemia (HCC)   COPD, severe (HCC)   Morbid obesity (HCC)   1. Acute on chronic respiratory failure with hypoxia patient is on a weaning protocol pressure support 12/5 the plan is going to be to continue to try to wean as tolerated.  The goal today is 8 hours 2. Acute stroke patient had an acute stroke at the other facility  we will have physical therapy evaluate and treat. 3. Severe COPD medical management nebulizers as necessary we will continue to monitor closely. 4. Morbid obesity patient also does have a history of obstructive sleep apnea and may very well limit [place] from being able to decannulate but we will see how the patient does.  I have personally seen and evaluated the patient, evaluated laboratory and imaging results, formulated the assessment and plan and placed orders. The Patient requires high complexity decision making with multiple systems involvement.  Case was discussed on Rounds with the Respiratory Therapy Director and the Respiratory staff Time Spent  Yevonne Pax, MD [facility] Pulmonary Critical Care Medicine Sleep Medicine

## 2020-08-29 DIAGNOSIS — J449 Chronic obstructive pulmonary disease, unspecified: Secondary | ICD-10-CM | POA: Diagnosis not present

## 2020-08-29 DIAGNOSIS — I639 Cerebral infarction, unspecified: Secondary | ICD-10-CM | POA: Diagnosis not present

## 2020-08-29 DIAGNOSIS — J9621 Acute and chronic respiratory failure with hypoxia: Secondary | ICD-10-CM | POA: Diagnosis not present

## 2020-08-29 NOTE — Progress Notes (Signed)
Pulmonary Critical Care Medicine [facility] [place]   PULMONARY CRITICAL CARE SERVICE  PROGRESS NOTE  Date of Service: 08/29/2020  Kristin Sampson  [id number]:[id number]  DOB: Apr 04, 1966   DOA: 08/26/2020  Referring Physician: Carron Curie, MD  HPI: Kristin Sampson is a 54 y.o. female seen for follow up of Acute on Chronic Respiratory Failure.  Patient is currently on pressure support has been on 28% FiO2 with goal today of 12 hours  Medications: Reviewed on Rounds  Physical Exam:  Vitals: Temperature is 97.8 pulse 78 respiratory rate 16 blood pressure is 120/71 saturations 98%  Ventilator Settings on pressure support FiO2 28% pressure 12/5  . General: Comfortable at this time . Eyes: Grossly normal lids, irises & conjunctiva . ENT: grossly tongue is normal . Neck: no obvious mass . Cardiovascular: S1 S2 normal no gallop . Respiratory: No rhonchi very coarse breath sounds . Abdomen: soft . Skin: no rash seen on limited exam . Musculoskeletal: not rigid . Psychiatric:unable to assess . Neurologic: no seizure no involuntary movements         Lab Data:   Basic Metabolic Panel: Recent Labs  Lab 08/27/20 0618  NA 135  K 4.2  CL 99  CO2 24  GLUCOSE 158*  BUN 21*  CREATININE 0.46  CALCIUM 9.4  MG 1.9  PHOS 4.4    ABG: Recent Labs  Lab 08/26/20 1858  PHART 7.462*  PCO2ART 36.0  PO2ART 141*  HCO3 25.4  O2SAT 99.1    Liver Function Tests: Recent Labs  Lab 08/27/20 0618  AST 20  ALT 23  ALKPHOS 81  BILITOT 0.8  PROT 7.9  ALBUMIN 2.9*   No results for input(s): LIPASE, AMYLASE in the last 168 hours. No results for input(s): AMMONIA in the last 168 hours.  CBC: Recent Labs  Lab 08/27/20 0618  WBC 13.3*  NEUTROABS 9.0*  HGB 11.9*  HCT 38.6  MCV 86.2  PLT 384    Cardiac Enzymes: No results for input(s): CKTOTAL, CKMB, CKMBINDEX, TROPONINI in the last 168 hours.  BNP (last 3 results) No results for input(s): BNP in the last 8760  hours.  ProBNP (last 3 results) No results for input(s): PROBNP in the last 8760 hours.  Radiological Exams: No results found.  Assessment/Plan Active Problems:   Acute on chronic respiratory failure with hypoxia (HCC)   Acute stroke due to ischemia (HCC)   COPD, severe (HCC)   Morbid obesity (HCC)   1. Acute on chronic respiratory failure with hypoxia we will continue with weaning 12-hour goal pressure support 2. Acute stroke supportive care 3. Severe COPD medical management 4. Morbid obesity dietary management   I have personally seen and evaluated the patient, evaluated laboratory and imaging results, formulated the assessment and plan and placed orders. The Patient requires high complexity decision making with multiple systems involvement.  Rounds were done with the Respiratory Therapy Director and Staff therapists and discussed with nursing staff also.  Yevonne Pax, MD [facility] Pulmonary Critical Care Medicine Sleep Medicine

## 2020-08-30 DIAGNOSIS — J9621 Acute and chronic respiratory failure with hypoxia: Secondary | ICD-10-CM | POA: Diagnosis not present

## 2020-08-30 DIAGNOSIS — I639 Cerebral infarction, unspecified: Secondary | ICD-10-CM | POA: Diagnosis not present

## 2020-08-30 DIAGNOSIS — J449 Chronic obstructive pulmonary disease, unspecified: Secondary | ICD-10-CM | POA: Diagnosis not present

## 2020-08-30 LAB — CBC
HCT: 39.8 % (ref 36.0–46.0)
Hemoglobin: 12.3 g/dL (ref 12.0–15.0)
MCH: 26.5 pg (ref 26.0–34.0)
MCHC: 30.9 g/dL (ref 30.0–36.0)
MCV: 85.6 fL (ref 80.0–100.0)
Platelets: 364 10*3/uL (ref 150–400)
RBC: 4.65 MIL/uL (ref 3.87–5.11)
RDW: 16.3 % — ABNORMAL HIGH (ref 11.5–15.5)
WBC: 14.9 10*3/uL — ABNORMAL HIGH (ref 4.0–10.5)
nRBC: 0 % (ref 0.0–0.2)

## 2020-08-30 LAB — MAGNESIUM: Magnesium: 2.2 mg/dL (ref 1.7–2.4)

## 2020-08-30 LAB — BASIC METABOLIC PANEL
Anion gap: 11 (ref 5–15)
BUN: 18 mg/dL (ref 6–20)
CO2: 25 mmol/L (ref 22–32)
Calcium: 9.5 mg/dL (ref 8.9–10.3)
Chloride: 97 mmol/L — ABNORMAL LOW (ref 98–111)
Creatinine, Ser: 0.54 mg/dL (ref 0.44–1.00)
GFR calc non Af Amer: >60 mL/min (ref >60)
Glucose, Bld: 160 mg/dL — ABNORMAL HIGH (ref 70–99)
Potassium: 4.1 mmol/L (ref 3.5–5.1)
Sodium: 133 mmol/L — ABNORMAL LOW (ref 135–145)

## 2020-08-30 NOTE — Progress Notes (Addendum)
Pulmonary Critical Care Medicine [facility] [place]   PULMONARY CRITICAL CARE SERVICE  PROGRESS NOTE  Date of Service: 08/30/2020  AMARYS SLIWINSKI  FUX:[id number]  DOB: 1966-07-07   DOA: 08/26/2020  Referring Physician: Carron Curie, MD  HPI: KYNLEY METZGER is a 54 y.o. female seen for follow up of Acute on Chronic Respiratory Failure.  Patient remains on aerosol trach collar at this time for 4-hour goal satting well no distress.  Medications: Reviewed on Rounds  Physical Exam:  Vitals: Pulse 112 respiration 17 BP 129/76 O2 sat 98% temp 96.0  Ventilator Settings ATC 28%  . General: Comfortable at this time . Eyes: Grossly normal lids, irises & conjunctiva . ENT: grossly tongue is normal . Neck: no obvious mass . Cardiovascular: S1 S2 normal no gallop . Respiratory: No rales or rhonchi noted . Abdomen: soft . Skin: no rash seen on limited exam . Musculoskeletal: not rigid . Psychiatric:unable to assess . Neurologic: no seizure no involuntary movements         Lab Data:   Basic Metabolic Panel: Recent Labs  Lab 08/27/20 0618 08/30/20 0405  NA 135 133*  K 4.2 4.1  CL 99 97*  CO2 24 25  GLUCOSE 158* 160*  BUN 21* 18  CREATININE 0.46 0.54  CALCIUM 9.4 9.5  MG 1.9 2.2  PHOS 4.4  --     ABG: Recent Labs  Lab 08/26/20 1858  PHART 7.462*  PCO2ART 36.0  PO2ART 141*  HCO3 25.4  O2SAT 99.1    Liver Function Tests: Recent Labs  Lab 08/27/20 0618  AST 20  ALT 23  ALKPHOS 81  BILITOT 0.8  PROT 7.9  ALBUMIN 2.9*   No results for input(s): LIPASE, AMYLASE in the last 168 hours. No results for input(s): AMMONIA in the last 168 hours.  CBC: Recent Labs  Lab 08/27/20 0618 08/30/20 0405  WBC 13.3* 14.9*  NEUTROABS 9.0*  --   HGB 11.9* 12.3  HCT 38.6 39.8  MCV 86.2 85.6  PLT 384 364    Cardiac Enzymes: No results for input(s): CKTOTAL, CKMB, CKMBINDEX, TROPONINI in the last 168 hours.  BNP (last 3 results) No results for input(s): BNP  in the last 8760 hours.  ProBNP (last 3 results) No results for input(s): PROBNP in the last 8760 hours.  Radiological Exams: No results found.  Assessment/Plan Active Problems:   Acute on chronic respiratory failure with hypoxia (HCC)   Acute stroke due to ischemia (HCC)   COPD, severe (HCC)   Morbid obesity (HCC)   1. Acute on chronic respiratory failure with hypoxia patient will continue weaning current goal on aerosol trach collar 20% is 4 hours satting well we will continue aggressive pulmonary toilet supportive measures. 2. Acute stroke supportive care 3. Severe COPD medical management 4. Morbid obesity dietary management   I have personally seen and evaluated the patient, evaluated laboratory and imaging results, formulated the assessment and plan and placed orders. The Patient requires high complexity decision making with multiple systems involvement.  Rounds were done with the Respiratory Therapy Director and Staff therapists and discussed with nursing staff also.  Yevonne Pax, MD [facility] Pulmonary Critical Care Medicine Sleep Medicine

## 2020-08-31 DIAGNOSIS — I639 Cerebral infarction, unspecified: Secondary | ICD-10-CM | POA: Diagnosis not present

## 2020-08-31 DIAGNOSIS — J9621 Acute and chronic respiratory failure with hypoxia: Secondary | ICD-10-CM | POA: Diagnosis not present

## 2020-08-31 DIAGNOSIS — J449 Chronic obstructive pulmonary disease, unspecified: Secondary | ICD-10-CM | POA: Diagnosis not present

## 2020-08-31 NOTE — Progress Notes (Signed)
Pulmonary Critical Care Medicine [facility] [place]   PULMONARY CRITICAL CARE SERVICE  PROGRESS NOTE  Date of Service: 08/31/2020  Kristin Sampson  [id number]:[id number]  DOB: 10-27-1966   DOA: 08/26/2020  Referring Physician: Carron Curie, MD  HPI: Kristin Sampson is a 54 y.o. female seen for follow up of Acute on Chronic Respiratory Failure.  Patient currently is on T collar has been on 20% FiO2 with good saturations.  Secretions have been fairly moderate  Medications: Reviewed on Rounds  Physical Exam:  Vitals: Temperature is 97.7 pulse 106 respiratory 17 blood pressure is 131/81 saturations 100%  Ventilator Settings on T collar FiO2 28%  . General: Comfortable at this time . Eyes: Grossly normal lids, irises & conjunctiva . ENT: grossly tongue is normal . Neck: no obvious mass . Cardiovascular: S1 S2 normal no gallop . Respiratory: Scattered rhonchi coarse breath sounds . Abdomen: soft . Skin: no rash seen on limited exam . Musculoskeletal: not rigid . Psychiatric:unable to assess . Neurologic: no seizure no involuntary movements         Lab Data:   Basic Metabolic Panel: Recent Labs  Lab 08/27/20 0618 08/30/20 0405  NA 135 133*  K 4.2 4.1  CL 99 97*  CO2 24 25  GLUCOSE 158* 160*  BUN 21* 18  CREATININE 0.46 0.54  CALCIUM 9.4 9.5  MG 1.9 2.2  PHOS 4.4  --     ABG: Recent Labs  Lab 08/26/20 1858  PHART 7.462*  PCO2ART 36.0  PO2ART 141*  HCO3 25.4  O2SAT 99.1    Liver Function Tests: Recent Labs  Lab 08/27/20 0618  AST 20  ALT 23  ALKPHOS 81  BILITOT 0.8  PROT 7.9  ALBUMIN 2.9*   No results for input(s): LIPASE, AMYLASE in the last 168 hours. No results for input(s): AMMONIA in the last 168 hours.  CBC: Recent Labs  Lab 08/27/20 0618 08/30/20 0405  WBC 13.3* 14.9*  NEUTROABS 9.0*  --   HGB 11.9* 12.3  HCT 38.6 39.8  MCV 86.2 85.6  PLT 384 364    Cardiac Enzymes: No results for input(s): CKTOTAL, CKMB, CKMBINDEX, TROPONINI  in the last 168 hours.  BNP (last 3 results) No results for input(s): BNP in the last 8760 hours.  ProBNP (last 3 results) No results for input(s): PROBNP in the last 8760 hours.  Radiological Exams: No results found.  Assessment/Plan Active Problems:   Acute on chronic respiratory failure with hypoxia (HCC)   Acute stroke due to ischemia (HCC)   COPD, severe (HCC)   Morbid obesity (HCC)   1. Acute on chronic respiratory failure with hypoxia we will continue with T collar trials patient is on 28% FiO2 secretions are fair to moderate. 2. Acute stroke no change we will continue to follow. 3. Severe COPD medical management 4. Morbid obesity supportive care dietary management   I have personally seen and evaluated the patient, evaluated laboratory and imaging results, formulated the assessment and plan and placed orders. The Patient requires high complexity decision making with multiple systems involvement.  Rounds were done with the Respiratory Therapy Director and Staff therapists and discussed with nursing staff also.  Yevonne Pax, MD [facility] Pulmonary Critical Care Medicine Sleep Medicine

## 2020-09-01 DIAGNOSIS — J9621 Acute and chronic respiratory failure with hypoxia: Secondary | ICD-10-CM | POA: Diagnosis not present

## 2020-09-01 DIAGNOSIS — I639 Cerebral infarction, unspecified: Secondary | ICD-10-CM | POA: Diagnosis not present

## 2020-09-01 DIAGNOSIS — J449 Chronic obstructive pulmonary disease, unspecified: Secondary | ICD-10-CM | POA: Diagnosis not present

## 2020-09-01 LAB — URINALYSIS, ROUTINE W REFLEX MICROSCOPIC
Bacteria, UA: NONE SEEN
Bilirubin Urine: NEGATIVE
Glucose, UA: NEGATIVE mg/dL
Ketones, ur: NEGATIVE mg/dL
Leukocytes,Ua: NEGATIVE
Nitrite: NEGATIVE
Protein, ur: NEGATIVE mg/dL
Specific Gravity, Urine: 1.006 (ref 1.005–1.030)
pH: 7 (ref 5.0–8.0)

## 2020-09-01 NOTE — Progress Notes (Signed)
Pulmonary Critical Care Medicine [facility] [place]   PULMONARY CRITICAL CARE SERVICE  PROGRESS NOTE  Date of Service: 09/01/2020  Kristin Sampson  [id number]  DOB: [birth year]   DOA: 08/26/2020  Referring Physician: Carron Curie, MD  HPI: Kristin Sampson is a 54 y.o. female seen for follow up of Acute on Chronic Respiratory Failure.  Currently off the ventilator on T collar has been on 20% FiO2 the goal is for 12 hours today  Medications: Reviewed on Rounds  Physical Exam:  Vitals: Temperature 96.3 pulse 107 respiratory rate 21 blood pressure is 103/72 saturations 100%  Ventilator Settings on T collar with an FiO2 of 28%   General: Comfortable at this time  Eyes: Grossly normal lids, irises & conjunctiva  ENT: grossly tongue is normal  Neck: no obvious mass  Cardiovascular: S1 S2 normal no gallop  Respiratory: Scattered rhonchi coarse breath sounds  Abdomen: soft  Skin: no rash seen on limited exam  Musculoskeletal: not rigid  Psychiatric:unable to assess  Neurologic: no seizure no involuntary movements         Lab Data:   Basic Metabolic Panel: Recent Labs  Lab 08/27/20 0618 08/30/20 0405  NA 135 133*  K 4.2 4.1  CL 99 97*  CO2 24 25  GLUCOSE 158* 160*  BUN 21* 18  CREATININE 0.46 0.54  CALCIUM 9.4 9.5  MG 1.9 2.2  PHOS 4.4  --     ABG: Recent Labs  Lab 08/26/20 1858  PHART 7.462*  PCO2ART 36.0  PO2ART 141*  HCO3 25.4  O2SAT 99.1    Liver Function Tests: Recent Labs  Lab 08/27/20 0618  AST 20  ALT 23  ALKPHOS 81  BILITOT 0.8  PROT 7.9  ALBUMIN 2.9*   No results for input(s): LIPASE, AMYLASE in the last 168 hours. No results for input(s): AMMONIA in the last 168 hours.  CBC: Recent Labs  Lab 08/27/20 0618 08/30/20 0405  WBC 13.3* 14.9*  NEUTROABS 9.0*  --   HGB 11.9* 12.3  HCT 38.6 39.8  MCV 86.2 85.6  PLT 384 364    Cardiac Enzymes: No results for input(s): CKTOTAL, CKMB, CKMBINDEX, TROPONINI in the  last 168 hours.  BNP (last 3 results) No results for input(s): BNP in the last 8760 hours.  ProBNP (last 3 results) No results for input(s): PROBNP in the last 8760 hours.  Radiological Exams: No results found.  Assessment/Plan Active Problems:   Acute on chronic respiratory failure with hypoxia (HCC)   Acute stroke due to ischemia (HCC)   COPD, severe (HCC)   Morbid obesity (HCC)   1. Acute on chronic respiratory failure with hypoxia [place] on T collar goal is 12 hours. 2. Severe COPD continue with medical management 3. Morbid obesity no change supportive care 4. Acute stroke patient is at baseline   I have personally seen and evaluated the patient, evaluated laboratory and imaging results, formulated the assessment and plan and placed orders. The Patient requires high complexity decision making with multiple systems involvement.  Rounds were done with the Respiratory Therapy Director and Staff therapists and discussed with nursing staff also.  Yevonne Pax, MD [facility] Pulmonary Critical Care Medicine Sleep Medicine

## 2020-09-02 DIAGNOSIS — J449 Chronic obstructive pulmonary disease, unspecified: Secondary | ICD-10-CM | POA: Diagnosis not present

## 2020-09-02 DIAGNOSIS — I639 Cerebral infarction, unspecified: Secondary | ICD-10-CM | POA: Diagnosis not present

## 2020-09-02 DIAGNOSIS — J9621 Acute and chronic respiratory failure with hypoxia: Secondary | ICD-10-CM | POA: Diagnosis not present

## 2020-09-02 LAB — URINE CULTURE

## 2020-09-02 LAB — CULTURE, RESPIRATORY W GRAM STAIN

## 2020-09-02 NOTE — Progress Notes (Signed)
Pulmonary Critical Care Medicine [facility] [place]   PULMONARY CRITICAL CARE SERVICE  PROGRESS NOTE  Date of Service: 09/02/2020  Kristin Sampson  DSK:[id number]  DOB: Jul 24, 1966   DOA: 08/26/2020  Referring Physician: Carron Curie, MD  HPI: Kristin Sampson is a 54 y.o. female seen for follow up of Acute on Chronic Respiratory Failure.  Patient at this time is on T collar currently on 28% FiO2 using PMV  Medications: Reviewed on Rounds  Physical Exam:  Vitals: Temperature is 96.7 pulse 91 respiratory 34 blood pressure is 128/74 saturations 98%  Ventilator Settings on T collar FiO2 28%  . General: Comfortable at this time . Eyes: Grossly normal lids, irises & conjunctiva . ENT: grossly tongue is normal . Neck: no obvious mass . Cardiovascular: S1 S2 normal no gallop . Respiratory: No rhonchi very coarse breath sounds . Abdomen: soft . Skin: no rash seen on limited exam . Musculoskeletal: not rigid . Psychiatric:unable to assess . Neurologic: no seizure no involuntary movements         Lab Data:   Basic Metabolic Panel: Recent Labs  Lab 08/27/20 0618 08/30/20 0405  NA 135 133*  K 4.2 4.1  CL 99 97*  CO2 24 25  GLUCOSE 158* 160*  BUN 21* 18  CREATININE 0.46 0.54  CALCIUM 9.4 9.5  MG 1.9 2.2  PHOS 4.4  --     ABG: Recent Labs  Lab 08/26/20 1858  PHART 7.462*  PCO2ART 36.0  PO2ART 141*  HCO3 25.4  O2SAT 99.1    Liver Function Tests: Recent Labs  Lab 08/27/20 0618  AST 20  ALT 23  ALKPHOS 81  BILITOT 0.8  PROT 7.9  ALBUMIN 2.9*   No results for input(s): LIPASE, AMYLASE in the last 168 hours. No results for input(s): AMMONIA in the last 168 hours.  CBC: Recent Labs  Lab 08/27/20 0618 08/30/20 0405  WBC 13.3* 14.9*  NEUTROABS 9.0*  --   HGB 11.9* 12.3  HCT 38.6 39.8  MCV 86.2 85.6  PLT 384 364    Cardiac Enzymes: No results for input(s): CKTOTAL, CKMB, CKMBINDEX, TROPONINI in the last 168 hours.  BNP (last 3  results) No results for input(s): BNP in the last 8760 hours.  ProBNP (last 3 results) No results for input(s): PROBNP in the last 8760 hours.  Radiological Exams: No results found.  Assessment/Plan Active Problems:   Acute on chronic respiratory failure with hypoxia (HCC)   Acute stroke due to ischemia (HCC)   COPD, severe (HCC)   Morbid obesity (HCC)   1. Acute on chronic respiratory failure with hypoxia we will continue T collar trials currently on 28% FiO2 using PMV. 2. Acute stroke supportive care therapy as tolerated 3. Severe COPD at baseline 4. Morbid obesity no change we will continue to follow along   I have personally seen and evaluated the patient, evaluated laboratory and imaging results, formulated the assessment and plan and placed orders. The Patient requires high complexity decision making with multiple systems involvement.  Rounds were done with the Respiratory Therapy Director and Staff therapists and discussed with nursing staff also.  Yevonne Pax, MD [facility] Pulmonary Critical Care Medicine Sleep Medicine

## 2020-09-03 ENCOUNTER — Other Ambulatory Visit (HOSPITAL_COMMUNITY): Payer: Medicare HMO

## 2020-09-03 ENCOUNTER — Encounter (HOSPITAL_COMMUNITY): Payer: Medicare HMO

## 2020-09-03 DIAGNOSIS — J9621 Acute and chronic respiratory failure with hypoxia: Secondary | ICD-10-CM | POA: Diagnosis not present

## 2020-09-03 DIAGNOSIS — I639 Cerebral infarction, unspecified: Secondary | ICD-10-CM | POA: Diagnosis not present

## 2020-09-03 DIAGNOSIS — J449 Chronic obstructive pulmonary disease, unspecified: Secondary | ICD-10-CM | POA: Diagnosis not present

## 2020-09-03 LAB — CBC
HCT: 43.1 % (ref 36.0–46.0)
Hemoglobin: 13.5 g/dL (ref 12.0–15.0)
MCH: 26.5 pg (ref 26.0–34.0)
MCHC: 31.3 g/dL (ref 30.0–36.0)
MCV: 84.7 fL (ref 80.0–100.0)
Platelets: 406 10*3/uL — ABNORMAL HIGH (ref 150–400)
RBC: 5.09 MIL/uL (ref 3.87–5.11)
RDW: 15.9 % — ABNORMAL HIGH (ref 11.5–15.5)
WBC: 16.9 10*3/uL — ABNORMAL HIGH (ref 4.0–10.5)
nRBC: 0 % (ref 0.0–0.2)

## 2020-09-03 LAB — BASIC METABOLIC PANEL
Anion gap: 12 (ref 5–15)
BUN: 15 mg/dL (ref 6–20)
CO2: 19 mmol/L — ABNORMAL LOW (ref 22–32)
Calcium: 7.4 mg/dL — ABNORMAL LOW (ref 8.9–10.3)
Chloride: 107 mmol/L (ref 98–111)
Creatinine, Ser: 0.47 mg/dL (ref 0.44–1.00)
GFR, Estimated: >60 mL/min (ref >60)
Glucose, Bld: 108 mg/dL — ABNORMAL HIGH (ref 70–99)
Potassium: 3.6 mmol/L (ref 3.5–5.1)
Sodium: 138 mmol/L (ref 135–145)

## 2020-09-03 LAB — PHOSPHORUS: Phosphorus: 3.2 mg/dL (ref 2.5–4.6)

## 2020-09-03 LAB — VANCOMYCIN, TROUGH: Vancomycin Tr: 23 ug/mL (ref 15–20)

## 2020-09-03 LAB — MAGNESIUM: Magnesium: 1.6 mg/dL — ABNORMAL LOW (ref 1.7–2.4)

## 2020-09-03 NOTE — Progress Notes (Addendum)
Pulmonary Critical Care Medicine [facility] [place]   PULMONARY CRITICAL CARE SERVICE  PROGRESS NOTE  Date of Service: 09/03/2020  Kristin Sampson  [id number]  DOB: 1966/02/16   DOA: 08/26/2020  Referring Physician: Carron Curie, MD  HPI: Kristin Sampson is a 54 y.o. female seen for follow up of Acute on Chronic Respiratory Failure.  Patient remains on aerosol trach collar 28% for a total of 48 hours today satting well no distress.  Medications: Reviewed on Rounds  Physical Exam:  Vitals: Pulse 94 respirations 29 BP 125/90 O2 sat 96% temp 97.3  Ventilator Settings not currently on ventilator  . General: Comfortable at this time . Eyes: Grossly normal lids, irises & conjunctiva . ENT: grossly tongue is normal . Neck: no obvious mass . Cardiovascular: S1 S2 normal no gallop . Respiratory: No rales or rhonchi noted . Abdomen: soft . Skin: no rash seen on limited exam . Musculoskeletal: not rigid . Psychiatric:unable to assess . Neurologic: no seizure no involuntary movements         Lab Data:   Basic Metabolic Panel: Recent Labs  Lab 08/30/20 0405 09/03/20 0509  NA 133* 138  K 4.1 3.6  CL 97* 107  CO2 25 19*  GLUCOSE 160* 108*  BUN 18 15  CREATININE 0.54 0.47  CALCIUM 9.5 7.4*  MG 2.2 1.6*  PHOS  --  3.2    ABG: No results for input(s): PHART, PCO2ART, PO2ART, HCO3, O2SAT in the last 168 hours.  Liver Function Tests: No results for input(s): AST, ALT, ALKPHOS, BILITOT, PROT, ALBUMIN in the last 168 hours. No results for input(s): LIPASE, AMYLASE in the last 168 hours. No results for input(s): AMMONIA in the last 168 hours.  CBC: Recent Labs  Lab 08/30/20 0405 09/03/20 0727  WBC 14.9* 16.9*  HGB 12.3 13.5  HCT 39.8 43.1  MCV 85.6 84.7  PLT 364 406*    Cardiac Enzymes: No results for input(s): CKTOTAL, CKMB, CKMBINDEX, TROPONINI in the last 168 hours.  BNP (last 3 results) No results for input(s): BNP in the last 8760  hours.  ProBNP (last 3 results) No results for input(s): PROBNP in the last 8760 hours.  Radiological Exams: DG CHEST PORT 1 VIEW  Result Date: 09/03/2020 CLINICAL DATA:  Acute on chronic respiratory failure. EXAM: PORTABLE CHEST 1 VIEW COMPARISON:  Single-view of the chest 08/27/2020. FINDINGS: Right PICC has been removed. Tracheostomy tube remains in place and projects in good position. Left basilar airspace disease has markedly improved. Small focus of discoid atelectasis in the right lung base noted. No pneumothorax or pleural effusion. Heart size is normal. IMPRESSION: Resolved left basilar airspace disease. Small focus of atelectasis in the right lung base noted. Status post removal of right PICC. Tracheostomy tube projects in good position. Electronically Signed   By: Drusilla Kanner M.D.   On: 09/03/2020 07:40    Assessment/Plan Active Problems:   Acute on chronic respiratory failure with hypoxia (HCC)   Acute stroke due to ischemia (HCC)   COPD, severe (HCC)   Morbid obesity (HCC)   1. Acute on chronic respiratory failure with hypoxia patient will continue to wean on aerosol trach collar 28% FiO2 for goal of 48 hours satting well at this time with no fever distress. 2. Acute stroke supportive care therapy as tolerated 3. Severe COPD at baseline 4. Morbid obesity no change we will continue to follow along   I have personally seen and evaluated the patient, evaluated laboratory and imaging  results, formulated the assessment and plan and placed orders. The Patient requires high complexity decision making with multiple systems involvement.  Rounds were done with the Respiratory Therapy Director and Staff therapists and discussed with nursing staff also.  Allyne Gee, MD [facility] Pulmonary Critical Care Medicine Sleep Medicine

## 2020-09-04 DIAGNOSIS — I639 Cerebral infarction, unspecified: Secondary | ICD-10-CM | POA: Diagnosis not present

## 2020-09-04 DIAGNOSIS — J449 Chronic obstructive pulmonary disease, unspecified: Secondary | ICD-10-CM | POA: Diagnosis not present

## 2020-09-04 DIAGNOSIS — J9621 Acute and chronic respiratory failure with hypoxia: Secondary | ICD-10-CM | POA: Diagnosis not present

## 2020-09-04 LAB — BASIC METABOLIC PANEL
Anion gap: 11 (ref 5–15)
BUN: 15 mg/dL (ref 6–20)
CO2: 25 mmol/L (ref 22–32)
Calcium: 9.5 mg/dL (ref 8.9–10.3)
Chloride: 99 mmol/L (ref 98–111)
Creatinine, Ser: 0.48 mg/dL (ref 0.44–1.00)
GFR, Estimated: >60 mL/min (ref >60)
Glucose, Bld: 147 mg/dL — ABNORMAL HIGH (ref 70–99)
Potassium: 4.5 mmol/L (ref 3.5–5.1)
Sodium: 135 mmol/L (ref 135–145)

## 2020-09-04 LAB — BLOOD GAS, ARTERIAL
Acid-Base Excess: 2.4 mmol/L — ABNORMAL HIGH (ref 0.0–2.0)
Bicarbonate: 26 mmol/L (ref 20.0–28.0)
FIO2: 28
O2 Saturation: 96.5 %
Patient temperature: 36.8
pCO2 arterial: 36.4 mmHg (ref 32.0–48.0)
pH, Arterial: 7.466 — ABNORMAL HIGH (ref 7.350–7.450)
pO2, Arterial: 83.5 mmHg (ref 83.0–108.0)

## 2020-09-04 LAB — MAGNESIUM: Magnesium: 1.9 mg/dL (ref 1.7–2.4)

## 2020-09-04 LAB — URINE CULTURE: Culture: <10000 — AB

## 2020-09-04 LAB — VANCOMYCIN, TROUGH: Vancomycin Tr: 12 ug/mL — ABNORMAL LOW (ref 15–20)

## 2020-09-04 NOTE — Progress Notes (Addendum)
Pulmonary Critical Care Medicine [facility] [place]   PULMONARY CRITICAL CARE SERVICE  PROGRESS NOTE  Date of Service: 09/04/2020  Kristin Sampson  GUR:[id number]  DOB: [birth year]   DOA: 08/26/2020  Referring Physician: Carron Curie, MD  HPI: Kristin Sampson is a 54 y.o. female seen for follow up of Acute on Chronic Respiratory Failure.  Patient remains weaning on aerosol trach collar currently satting well no distress.  Medications: Reviewed on Rounds  Physical Exam:  Vitals: Pulse 83 respirations 18 BP 108/64 O2 sat 99% temp 96.4  Ventilator Settings not currently on ventilator  . General: Comfortable at this time . Eyes: Grossly normal lids, irises & conjunctiva . ENT: grossly tongue is normal . Neck: no obvious mass . Cardiovascular: S1 S2 normal no gallop . Respiratory: No rales or rhonchi noted . Abdomen: soft . Skin: no rash seen on limited exam . Musculoskeletal: not rigid . Psychiatric:unable to assess . Neurologic: no seizure no involuntary movements         Lab Data:   Basic Metabolic Panel: Recent Labs  Lab 08/30/20 0405 09/03/20 0509 09/04/20 0613  NA 133* 138 135  K 4.1 3.6 4.5  CL 97* 107 99  CO2 25 19* 25  GLUCOSE 160* 108* 147*  BUN 18 15 15   CREATININE 0.54 0.47 0.48  CALCIUM 9.5 7.4* 9.5  MG 2.2 1.6* 1.9  PHOS  --  3.2  --     ABG: Recent Labs  Lab 09/04/20 0927  PHART 7.466*  PCO2ART 36.4  PO2ART 83.5  HCO3 26.0  O2SAT 96.5    Liver Function Tests: No results for input(s): AST, ALT, ALKPHOS, BILITOT, PROT, ALBUMIN in the last 168 hours. No results for input(s): LIPASE, AMYLASE in the last 168 hours. No results for input(s): AMMONIA in the last 168 hours.  CBC: Recent Labs  Lab 08/30/20 0405 09/03/20 0727  WBC 14.9* 16.9*  HGB 12.3 13.5  HCT 39.8 43.1  MCV 85.6 84.7  PLT 364 406*    Cardiac Enzymes: No results for input(s): CKTOTAL, CKMB, CKMBINDEX, TROPONINI in the last 168 hours.  BNP (last 3  results) No results for input(s): BNP in the last 8760 hours.  ProBNP (last 3 results) No results for input(s): PROBNP in the last 8760 hours.  Radiological Exams: DG CHEST PORT 1 VIEW  Result Date: 09/03/2020 CLINICAL DATA:  Acute on chronic respiratory failure. EXAM: PORTABLE CHEST 1 VIEW COMPARISON:  Single-view of the chest 08/27/2020. FINDINGS: Right PICC has been removed. Tracheostomy tube remains in place and projects in good position. Left basilar airspace disease has markedly improved. Small focus of discoid atelectasis in the right lung base noted. No pneumothorax or pleural effusion. Heart size is normal. IMPRESSION: Resolved left basilar airspace disease. Small focus of atelectasis in the right lung base noted. Status post removal of right PICC. Tracheostomy tube projects in good position. Electronically Signed   By: 10/27/2020 M.D.   On: 09/03/2020 07:40    Assessment/Plan Active Problems:   Acute on chronic respiratory failure with hypoxia (HCC)   Acute stroke due to ischemia (HCC)   COPD, severe (HCC)   Morbid obesity (HCC)   1. Acute on chronic respiratory failure with hypoxia  patient will continue on 20% aerosol trach collar plan is to downsize to a cuffless trach today.  We will continue aggressive pulmonary toilet supportive measures. 2. Acute stroke supportive care therapy as tolerated 3. Severe COPD at baseline 4. Morbid obesity no change we  will continue to follow along   I have personally seen and evaluated the patient, evaluated laboratory and imaging results, formulated the assessment and plan and placed orders. The Patient requires high complexity decision making with multiple systems involvement.  Rounds were done with the Respiratory Therapy Director and Staff therapists and discussed with nursing staff also.  Yevonne Pax, MD [facility] Pulmonary Critical Care Medicine Sleep Medicine

## 2020-09-05 DIAGNOSIS — J449 Chronic obstructive pulmonary disease, unspecified: Secondary | ICD-10-CM | POA: Diagnosis not present

## 2020-09-05 DIAGNOSIS — I639 Cerebral infarction, unspecified: Secondary | ICD-10-CM | POA: Diagnosis not present

## 2020-09-05 DIAGNOSIS — J9621 Acute and chronic respiratory failure with hypoxia: Secondary | ICD-10-CM | POA: Diagnosis not present

## 2020-09-05 NOTE — Progress Notes (Addendum)
Pulmonary Critical Care Medicine [facility] [place]   PULMONARY CRITICAL CARE SERVICE  PROGRESS NOTE  Date of Service: 09/05/2020  Kristin Sampson  FFM:[id number]  DOB: 11/29/1965   DOA: 08/26/2020  Referring Physician: Carron Curie, MD  HPI: Kristin Sampson is a 54 y.o. female seen for follow up of Acute on Chronic Respiratory Failure.  Patient continues on aerosol trach collar using PMV with no difficulty satting well no distress.  Medications: Reviewed on Rounds  Physical Exam:  Vitals: Pulse 97 respirations 18 BP 110/65 O2 sat 96% temp 97.4  Ventilator Settings not currently on ventilator  . General: Comfortable at this time . Eyes: Grossly normal lids, irises & conjunctiva . ENT: grossly tongue is normal . Neck: no obvious mass . Cardiovascular: S1 S2 normal no gallop . Respiratory: No rales or rhonchi noted . Abdomen: soft . Skin: no rash seen on limited exam . Musculoskeletal: not rigid . Psychiatric:unable to assess . Neurologic: no seizure no involuntary movements         Lab Data:   Basic Metabolic Panel: Recent Labs  Lab 08/30/20 0405 09/03/20 0509 09/04/20 0613  NA 133* 138 135  K 4.1 3.6 4.5  CL 97* 107 99  CO2 25 19* 25  GLUCOSE 160* 108* 147*  BUN 18 15 15   CREATININE 0.54 0.47 0.48  CALCIUM 9.5 7.4* 9.5  MG 2.2 1.6* 1.9  PHOS  --  3.2  --     ABG: Recent Labs  Lab 09/04/20 0927  PHART 7.466*  PCO2ART 36.4  PO2ART 83.5  HCO3 26.0  O2SAT 96.5    Liver Function Tests: No results for input(s): AST, ALT, ALKPHOS, BILITOT, PROT, ALBUMIN in the last 168 hours. No results for input(s): LIPASE, AMYLASE in the last 168 hours. No results for input(s): AMMONIA in the last 168 hours.  CBC: Recent Labs  Lab 08/30/20 0405 09/03/20 0727  WBC 14.9* 16.9*  HGB 12.3 13.5  HCT 39.8 43.1  MCV 85.6 84.7  PLT 364 406*    Cardiac Enzymes: No results for input(s): CKTOTAL, CKMB, CKMBINDEX, TROPONINI in the last 168 hours.  BNP  (last 3 results) No results for input(s): BNP in the last 8760 hours.  ProBNP (last 3 results) No results for input(s): PROBNP in the last 8760 hours.  Radiological Exams: No results found.  Assessment/Plan Active Problems:   Acute on chronic respiratory failure with hypoxia (HCC)   Acute stroke due to ischemia (HCC)   COPD, severe (HCC)   Morbid obesity (HCC)   1. Acute on chronic respiratory failure with hypoxiapatient remains on 28% aerosol trach collar using PMV with no difficulty we will continue aggressive pulmonary toilet supportive measures continue to attempt weaning as tolerated. 2. Acute stroke supportive care therapy as tolerated 3. Severe COPD at baseline 4. Morbid obesity no change we will continue to follow along   I have personally seen and evaluated the patient, evaluated laboratory and imaging results, formulated the assessment and plan and placed orders. The Patient requires high complexity decision making with multiple systems involvement.  Rounds were done with the Respiratory Therapy Director and Staff therapists and discussed with nursing staff also.  11/03/20, MD [facility] Pulmonary Critical Care Medicine Sleep Medicine

## 2020-09-06 ENCOUNTER — Other Ambulatory Visit (HOSPITAL_COMMUNITY): Payer: Medicare HMO

## 2020-09-06 DIAGNOSIS — I639 Cerebral infarction, unspecified: Secondary | ICD-10-CM | POA: Diagnosis not present

## 2020-09-06 DIAGNOSIS — J449 Chronic obstructive pulmonary disease, unspecified: Secondary | ICD-10-CM | POA: Diagnosis not present

## 2020-09-06 DIAGNOSIS — J9621 Acute and chronic respiratory failure with hypoxia: Secondary | ICD-10-CM | POA: Diagnosis not present

## 2020-09-06 LAB — CBC
HCT: 42.5 % (ref 36.0–46.0)
Hemoglobin: 13.5 g/dL (ref 12.0–15.0)
MCH: 27.2 pg (ref 26.0–34.0)
MCHC: 31.8 g/dL (ref 30.0–36.0)
MCV: 85.5 fL (ref 80.0–100.0)
Platelets: 442 10*3/uL — ABNORMAL HIGH (ref 150–400)
RBC: 4.97 MIL/uL (ref 3.87–5.11)
RDW: 16.3 % — ABNORMAL HIGH (ref 11.5–15.5)
WBC: 16.1 10*3/uL — ABNORMAL HIGH (ref 4.0–10.5)
nRBC: 0 % (ref 0.0–0.2)

## 2020-09-06 LAB — MAGNESIUM: Magnesium: 2 mg/dL (ref 1.7–2.4)

## 2020-09-06 LAB — BASIC METABOLIC PANEL
Anion gap: 10 (ref 5–15)
BUN: 15 mg/dL (ref 6–20)
CO2: 26 mmol/L (ref 22–32)
Calcium: 9.7 mg/dL (ref 8.9–10.3)
Chloride: 98 mmol/L (ref 98–111)
Creatinine, Ser: 0.53 mg/dL (ref 0.44–1.00)
GFR, Estimated: >60 mL/min (ref >60)
Glucose, Bld: 163 mg/dL — ABNORMAL HIGH (ref 70–99)
Potassium: 4.4 mmol/L (ref 3.5–5.1)
Sodium: 134 mmol/L — ABNORMAL LOW (ref 135–145)

## 2020-09-06 LAB — PHOSPHORUS: Phosphorus: 4.6 mg/dL (ref 2.5–4.6)

## 2020-09-06 NOTE — Progress Notes (Addendum)
Pulmonary Critical Care Medicine [facility] [place]   PULMONARY CRITICAL CARE SERVICE  PROGRESS NOTE  Date of Service: 09/06/2020  SMT. LODER  IRS:[id number]  DOB: 11-Jul-1966   DOA: 08/26/2020  Referring Physician: Carron Curie, MD  HPI: Kristin Sampson is a 54 y.o. female seen for follow up of Acute on Chronic Respiratory Failure.  Patient mains on 20% aerosol trach collar using PMV with no difficulty satting well no fever distress.  Medications: Reviewed on Rounds  Physical Exam:  Vitals: Pulse 73 respirations 20 BP 120/65 O2 sat 100% temp 97.9  Ventilator Settings not currently on ventilator   . General: Comfortable at this time . Eyes: Grossly normal lids, irises & conjunctiva . ENT: grossly tongue is normal . Neck: no obvious mass . Cardiovascular: S1 S2 normal no gallop . Respiratory: No rales or rhonchi noted . Abdomen: soft . Skin: no rash seen on limited exam . Musculoskeletal: not rigid . Psychiatric:unable to assess . Neurologic: no seizure no involuntary movements         Lab Data:   Basic Metabolic Panel: Recent Labs  Lab 09/03/20 0509 09/04/20 0613 09/06/20 0434  NA 138 135 134*  K 3.6 4.5 4.4  CL 107 99 98  CO2 19* 25 26  GLUCOSE 108* 147* 163*  BUN 15 15 15   CREATININE 0.47 0.48 0.53  CALCIUM 7.4* 9.5 9.7  MG 1.6* 1.9 2.0  PHOS 3.2  --  4.6    ABG: Recent Labs  Lab 09/04/20 0927  PHART 7.466*  PCO2ART 36.4  PO2ART 83.5  HCO3 26.0  O2SAT 96.5    Liver Function Tests: No results for input(s): AST, ALT, ALKPHOS, BILITOT, PROT, ALBUMIN in the last 168 hours. No results for input(s): LIPASE, AMYLASE in the last 168 hours. No results for input(s): AMMONIA in the last 168 hours.  CBC: Recent Labs  Lab 09/03/20 0727 09/06/20 0434  WBC 16.9* 16.1*  HGB 13.5 13.5  HCT 43.1 42.5  MCV 84.7 85.5  PLT 406* 442*    Cardiac Enzymes: No results for input(s): CKTOTAL, CKMB, CKMBINDEX, TROPONINI in the last 168  hours.  BNP (last 3 results) No results for input(s): BNP in the last 8760 hours.  ProBNP (last 3 results) No results for input(s): PROBNP in the last 8760 hours.  Radiological Exams: DG Chest Port 1 View  Result Date: 09/06/2020 CLINICAL DATA:  Pneumonia EXAM: PORTABLE CHEST 1 VIEW COMPARISON:  One-view chest x-ray/12/21 FINDINGS: Tracheostomy tube is stable. Heart is enlarged, exaggerated by low lung volumes. Increasing bibasilar airspace opacities are present. IMPRESSION: 1. Increasing bibasilar airspace disease concerning for pneumonia. 2. Stable cardiomegaly. 3. Stable tracheostomy tube. Electronically Signed   By: 03-28-1970 M.D.   On: 09/06/2020 04:46    Assessment/Plan Active Problems:   Acute on chronic respiratory failure with hypoxia (HCC)   Acute stroke due to ischemia (HCC)   COPD, severe (HCC)   Morbid obesity (HCC)   1. Acute on chronic respiratory failure with hypoxiapatient remains on 28% aerosol trach collar using PMV with no difficulty we will continue aggressive pulmonary toilet supportive measures continue to attempt weaning as tolerated. 2. Acute stroke supportive care therapy as tolerated 3. Severe COPD at baseline 4. Morbid obesity no change we will continue to follow along   I have personally seen and evaluated the patient, evaluated laboratory and imaging results, formulated the assessment and plan and placed orders. The Patient requires high complexity decision making with multiple systems involvement.  Rounds were done with the Respiratory Therapy Director and Staff therapists and discussed with nursing staff also.  Allyne Gee, MD [facility] Pulmonary Critical Care Medicine Sleep Medicine

## 2020-09-07 DIAGNOSIS — J449 Chronic obstructive pulmonary disease, unspecified: Secondary | ICD-10-CM | POA: Diagnosis not present

## 2020-09-07 DIAGNOSIS — J9621 Acute and chronic respiratory failure with hypoxia: Secondary | ICD-10-CM | POA: Diagnosis not present

## 2020-09-07 DIAGNOSIS — I639 Cerebral infarction, unspecified: Secondary | ICD-10-CM | POA: Diagnosis not present

## 2020-09-07 LAB — VANCOMYCIN, TROUGH: Vancomycin Tr: 6 ug/mL — ABNORMAL LOW (ref 15–20)

## 2020-09-07 NOTE — Progress Notes (Signed)
Pulmonary Critical Care Medicine [facility] [place]   PULMONARY CRITICAL CARE SERVICE  PROGRESS NOTE  Date of Service: 09/07/2020  Kristin Sampson  ION:[id number]  DOB: March 15, 1966   DOA: 08/26/2020  Referring Physician: Carron Curie, MD  HPI: Kristin Sampson is a 54 y.o. female seen for follow up of Acute on Chronic Respiratory Failure.  Patient currently is on T collar has been on 28% FiO2 using the PMV  Medications: Reviewed on Rounds  Physical Exam:  Vitals: Temperature 97.0 pulse 71 respiratory rate 24 blood pressure is 111/74 saturations 98%  Ventilator Settings on T collar with an FiO2 28%   General: Comfortable at this time  Eyes: Grossly normal lids, irises & conjunctiva  ENT: grossly tongue is normal  Neck: no obvious mass  Cardiovascular: S1 S2 normal no gallop  Respiratory: No rhonchi very coarse breath sounds  Abdomen: soft  Skin: no rash seen on limited exam  Musculoskeletal: not rigid  Psychiatric:unable to assess  Neurologic: no seizure no involuntary movements         Lab Data:   Basic Metabolic Panel: Recent Labs  Lab 09/03/20 0509 09/04/20 0613 09/06/20 0434  NA 138 135 134*  K 3.6 4.5 4.4  CL 107 99 98  CO2 19* 25 26  GLUCOSE 108* 147* 163*  BUN 15 15 15   CREATININE 0.47 0.48 0.53  CALCIUM 7.4* 9.5 9.7  MG 1.6* 1.9 2.0  PHOS 3.2  --  4.6    ABG: Recent Labs  Lab 09/04/20 0927  PHART 7.466*  PCO2ART 36.4  PO2ART 83.5  HCO3 26.0  O2SAT 96.5    Liver Function Tests: No results for input(s): AST, ALT, ALKPHOS, BILITOT, PROT, ALBUMIN in the last 168 hours. No results for input(s): LIPASE, AMYLASE in the last 168 hours. No results for input(s): AMMONIA in the last 168 hours.  CBC: Recent Labs  Lab 09/03/20 0727 09/06/20 0434  WBC 16.9* 16.1*  HGB 13.5 13.5  HCT 43.1 42.5  MCV 84.7 85.5  PLT 406* 442*    Cardiac Enzymes: No results for input(s): CKTOTAL, CKMB, CKMBINDEX, TROPONINI in the last 168  hours.  BNP (last 3 results) No results for input(s): BNP in the last 8760 hours.  ProBNP (last 3 results) No results for input(s): PROBNP in the last 8760 hours.  Radiological Exams: DG Chest Port 1 View  Result Date: 09/06/2020 CLINICAL DATA:  Pneumonia EXAM: PORTABLE CHEST 1 VIEW COMPARISON:  One-view chest x-ray/12/21 FINDINGS: Tracheostomy tube is stable. Heart is enlarged, exaggerated by low lung volumes. Increasing bibasilar airspace opacities are present. IMPRESSION: 1. Increasing bibasilar airspace disease concerning for pneumonia. 2. Stable cardiomegaly. 3. Stable tracheostomy tube. Electronically Signed   By: 03-28-1970 M.D.   On: 09/06/2020 04:46    Assessment/Plan Active Problems:   Acute on chronic respiratory failure with hypoxia (HCC)   Acute stroke due to ischemia (HCC)   COPD, severe (HCC)   Morbid obesity (HCC)   1. Acute on chronic respiratory failure hypoxia we will continue with T collar weaning as ordered.  Patient is ready for capping spoke with respiratory therapy to begin the capping process 2. Acute stroke no change we will continue with supportive care. 3. Severe COPD medical management.  Patient had some increased congestion on the lower portions of the lungs needs pulmonary toileting 4. Morbid obesity supportive care dietary management   I have personally seen and evaluated the patient, evaluated laboratory and imaging results, formulated the assessment and plan  and placed orders. The Patient requires high complexity decision making with multiple systems involvement.  Rounds were done with the Respiratory Therapy Director and Staff therapists and discussed with nursing staff also.  Yevonne Pax, MD [facility] Pulmonary Critical Care Medicine Sleep Medicine

## 2020-09-08 DIAGNOSIS — J449 Chronic obstructive pulmonary disease, unspecified: Secondary | ICD-10-CM | POA: Diagnosis not present

## 2020-09-08 DIAGNOSIS — J9621 Acute and chronic respiratory failure with hypoxia: Secondary | ICD-10-CM | POA: Diagnosis not present

## 2020-09-08 DIAGNOSIS — I639 Cerebral infarction, unspecified: Secondary | ICD-10-CM | POA: Diagnosis not present

## 2020-09-08 NOTE — Progress Notes (Addendum)
Pulmonary Critical Care Medicine [facility] [place]   PULMONARY CRITICAL CARE SERVICE  PROGRESS NOTE  Date of Service: 09/08/2020  SEELEY SOUTHGATE  KNL:[id number]  DOB: 07-30-1966   DOA: 08/26/2020  Referring Physician: Carron Curie, MD  HPI: EMERALD Kristin Sampson is a 54 y.o. female seen for follow up of Acute on Chronic Respiratory Failure.  Patient remains capped on room air for 24 hours at this time satting well no distress.  Medications: Reviewed on Rounds  Physical Exam:  Vitals: Pulse 64 respirations 28 BP 115/71 O2 sat 98% temp 97.0  Ventilator Settings not currently on ventilator  . General: Comfortable at this time . Eyes: Grossly normal lids, irises & conjunctiva . ENT: grossly tongue is normal . Neck: no obvious mass . Cardiovascular: S1 S2 normal no gallop . Respiratory: No rales or rhonchi noted . Abdomen: soft . Skin: no rash seen on limited exam . Musculoskeletal: not rigid . Psychiatric:unable to assess . Neurologic: no seizure no involuntary movements         Lab Data:   Basic Metabolic Panel: Recent Labs  Lab 09/03/20 0509 09/04/20 0613 09/06/20 0434  NA 138 135 134*  K 3.6 4.5 4.4  CL 107 99 98  CO2 19* 25 26  GLUCOSE 108* 147* 163*  BUN 15 15 15   CREATININE 0.47 0.48 0.53  CALCIUM 7.4* 9.5 9.7  MG 1.6* 1.9 2.0  PHOS 3.2  --  4.6    ABG: Recent Labs  Lab 09/04/20 0927  PHART 7.466*  PCO2ART 36.4  PO2ART 83.5  HCO3 26.0  O2SAT 96.5    Liver Function Tests: No results for input(s): AST, ALT, ALKPHOS, BILITOT, PROT, ALBUMIN in the last 168 hours. No results for input(s): LIPASE, AMYLASE in the last 168 hours. No results for input(s): AMMONIA in the last 168 hours.  CBC: Recent Labs  Lab 09/03/20 0727 09/06/20 0434  WBC 16.9* 16.1*  HGB 13.5 13.5  HCT 43.1 42.5  MCV 84.7 85.5  PLT 406* 442*    Cardiac Enzymes: No results for input(s): CKTOTAL, CKMB, CKMBINDEX, TROPONINI in the last 168 hours.  BNP (last 3  results) No results for input(s): BNP in the last 8760 hours.  ProBNP (last 3 results) No results for input(s): PROBNP in the last 8760 hours.  Radiological Exams: No results found.  Assessment/Plan Active Problems:   Acute on chronic respiratory failure with hypoxia (HCC)   Acute stroke due to ischemia (HCC)   COPD, severe (HCC)   Morbid obesity (HCC)   1. Acute on chronic respiratory failure hypoxia patient will continue with capping on room air at this time continue supportive measures pulmonary toilet. 2. Severe COPD medical management.  Patient had some increased congestion on the lower portions of the lungs needs pulmonary toileting 3. Morbid obesity supportive care dietary management   I have personally seen and evaluated the patient, evaluated laboratory and imaging results, formulated the assessment and plan and placed orders. The Patient requires high complexity decision making with multiple systems involvement.  Rounds were done with the Respiratory Therapy Director and Staff therapists and discussed with nursing staff also.  09/08/20, MD [facility] Pulmonary Critical Care Medicine Sleep Medicine

## 2020-09-09 DIAGNOSIS — J9621 Acute and chronic respiratory failure with hypoxia: Secondary | ICD-10-CM | POA: Diagnosis not present

## 2020-09-09 DIAGNOSIS — J449 Chronic obstructive pulmonary disease, unspecified: Secondary | ICD-10-CM | POA: Diagnosis not present

## 2020-09-09 DIAGNOSIS — I639 Cerebral infarction, unspecified: Secondary | ICD-10-CM | POA: Diagnosis not present

## 2020-09-09 LAB — BASIC METABOLIC PANEL
Anion gap: 13 (ref 5–15)
BUN: 16 mg/dL (ref 6–20)
CO2: 23 mmol/L (ref 22–32)
Calcium: 9.8 mg/dL (ref 8.9–10.3)
Chloride: 99 mmol/L (ref 98–111)
Creatinine, Ser: 0.52 mg/dL (ref 0.44–1.00)
GFR, Estimated: >60 mL/min (ref >60)
Glucose, Bld: 144 mg/dL — ABNORMAL HIGH (ref 70–99)
Potassium: 4.7 mmol/L (ref 3.5–5.1)
Sodium: 135 mmol/L (ref 135–145)

## 2020-09-09 LAB — CBC
HCT: 44.9 % (ref 36.0–46.0)
Hemoglobin: 14.2 g/dL (ref 12.0–15.0)
MCH: 26.5 pg (ref 26.0–34.0)
MCHC: 31.6 g/dL (ref 30.0–36.0)
MCV: 83.9 fL (ref 80.0–100.0)
Platelets: 411 10*3/uL — ABNORMAL HIGH (ref 150–400)
RBC: 5.35 MIL/uL — ABNORMAL HIGH (ref 3.87–5.11)
RDW: 16 % — ABNORMAL HIGH (ref 11.5–15.5)
WBC: 16.1 10*3/uL — ABNORMAL HIGH (ref 4.0–10.5)
nRBC: 0 % (ref 0.0–0.2)

## 2020-09-09 LAB — VANCOMYCIN, TROUGH: Vancomycin Tr: 8 ug/mL — ABNORMAL LOW (ref 15–20)

## 2020-09-09 LAB — PHOSPHORUS: Phosphorus: 5.3 mg/dL — ABNORMAL HIGH (ref 2.5–4.6)

## 2020-09-09 LAB — MAGNESIUM: Magnesium: 2 mg/dL (ref 1.7–2.4)

## 2020-09-09 NOTE — Progress Notes (Signed)
Pulmonary Critical Care Medicine [facility] [place]   PULMONARY CRITICAL CARE SERVICE  PROGRESS NOTE  Date of Service: 09/09/2020  Kristin Sampson  DQQ:[id number]  DOB: 1966/09/25   DOA: 08/26/2020  Referring Physician: Carron Curie, MD  HPI: Kristin Sampson is a 54 y.o. female seen for follow up of Acute on Chronic Respiratory Failure.  Patient currently is on capping trials has been on room air.  Right now good saturations are noted  Medications: Reviewed on Rounds  Physical Exam:  Vitals: Temperature 96.7 pulse 96 respiratory 29 blood pressure is 127/70 saturations 97%  Ventilator Settings capping right now on room air  . General: Comfortable at this time . Eyes: Grossly normal lids, irises & conjunctiva . ENT: grossly tongue is normal . Neck: no obvious mass . Cardiovascular: S1 S2 normal no gallop . Respiratory: No rhonchi very coarse breath sounds . Abdomen: soft . Skin: no rash seen on limited exam . Musculoskeletal: not rigid . Psychiatric:unable to assess . Neurologic: no seizure no involuntary movements         Lab Data:   Basic Metabolic Panel: Recent Labs  Lab 09/03/20 0509 09/04/20 0613 09/06/20 0434 09/09/20 0708  NA 138 135 134* 135  K 3.6 4.5 4.4 4.7  CL 107 99 98 99  CO2 19* 25 26 23   GLUCOSE 108* 147* 163* 144*  BUN 15 15 15 16   CREATININE 0.47 0.48 0.53 0.52  CALCIUM 7.4* 9.5 9.7 9.8  MG 1.6* 1.9 2.0 2.0  PHOS 3.2  --  4.6 5.3*    ABG: Recent Labs  Lab 09/04/20 0927  PHART 7.466*  PCO2ART 36.4  PO2ART 83.5  HCO3 26.0  O2SAT 96.5    Liver Function Tests: No results for input(s): AST, ALT, ALKPHOS, BILITOT, PROT, ALBUMIN in the last 168 hours. No results for input(s): LIPASE, AMYLASE in the last 168 hours. No results for input(s): AMMONIA in the last 168 hours.  CBC: Recent Labs  Lab 09/03/20 0727 09/06/20 0434 09/09/20 0708  WBC 16.9* 16.1* 16.1*  HGB 13.5 13.5 14.2  HCT 43.1 42.5 44.9  MCV 84.7 85.5 83.9   PLT 406* 442* 411*    Cardiac Enzymes: No results for input(s): CKTOTAL, CKMB, CKMBINDEX, TROPONINI in the last 168 hours.  BNP (last 3 results) No results for input(s): BNP in the last 8760 hours.  ProBNP (last 3 results) No results for input(s): PROBNP in the last 8760 hours.  Radiological Exams: No results found.  Assessment/Plan Active Problems:   Acute on chronic respiratory failure with hypoxia (HCC)   Acute stroke due to ischemia (HCC)   COPD, severe (HCC)   Morbid obesity (HCC)   1. Acute on chronic respiratory failure hypoxia we'll proceed to 48 hours of capping today. 2. Acute stroke no change continue present management 3. Severe COPD at baseline we'll continue with supportive care 4. Morbid obesity no change   I have personally seen and evaluated the patient, evaluated laboratory and imaging results, formulated the assessment and plan and placed orders. The Patient requires high complexity decision making with multiple systems involvement.  Rounds were done with the Respiratory Therapy Director and Staff therapists and discussed with nursing staff also.  09/08/20, MD [facility] Pulmonary Critical Care Medicine Sleep Medicine

## 2020-09-10 DIAGNOSIS — J9621 Acute and chronic respiratory failure with hypoxia: Secondary | ICD-10-CM | POA: Diagnosis not present

## 2020-09-10 DIAGNOSIS — I639 Cerebral infarction, unspecified: Secondary | ICD-10-CM | POA: Diagnosis not present

## 2020-09-10 DIAGNOSIS — J449 Chronic obstructive pulmonary disease, unspecified: Secondary | ICD-10-CM | POA: Diagnosis not present

## 2020-09-10 NOTE — Progress Notes (Addendum)
Pulmonary Critical Care Medicine [facility] [place]   PULMONARY CRITICAL CARE SERVICE  PROGRESS NOTE  Date of Service: 09/10/2020  Kristin Sampson  WUJ:[id number]  DOB: 1966/04/15   DOA: 08/26/2020  Referring Physician: Carron Curie, MD  HPI: Kristin Sampson is a 54 y.o. female seen for follow up of Acute on Chronic Respiratory Failure.  Patient continues to be capped on room air satting well no distress.  Medications: Reviewed on Rounds  Physical Exam:  Vitals: Pulse 99 respirations 16 BP 111/85 O2 sat 97% temp 96.9  Ventilator Settings not currently on ventilator  . General: Comfortable at this time . Eyes: Grossly normal lids, irises & conjunctiva . ENT: grossly tongue is normal . Neck: no obvious mass . Cardiovascular: S1 S2 normal no gallop . Respiratory: No rales or rhonchi noted . Abdomen: soft . Skin: no rash seen on limited exam . Musculoskeletal: not rigid . Psychiatric:unable to assess . Neurologic: no seizure no involuntary movements         Lab Data:   Basic Metabolic Panel: Recent Labs  Lab 09/04/20 0613 09/06/20 0434 09/09/20 0708  NA 135 134* 135  K 4.5 4.4 4.7  CL 99 98 99  CO2 25 26 23   GLUCOSE 147* 163* 144*  BUN 15 15 16   CREATININE 0.48 0.53 0.52  CALCIUM 9.5 9.7 9.8  MG 1.9 2.0 2.0  PHOS  --  4.6 5.3*    ABG: Recent Labs  Lab 09/04/20 0927  PHART 7.466*  PCO2ART 36.4  PO2ART 83.5  HCO3 26.0  O2SAT 96.5    Liver Function Tests: No results for input(s): AST, ALT, ALKPHOS, BILITOT, PROT, ALBUMIN in the last 168 hours. No results for input(s): LIPASE, AMYLASE in the last 168 hours. No results for input(s): AMMONIA in the last 168 hours.  CBC: Recent Labs  Lab 09/06/20 0434 09/09/20 0708  WBC 16.1* 16.1*  HGB 13.5 14.2  HCT 42.5 44.9  MCV 85.5 83.9  PLT 442* 411*    Cardiac Enzymes: No results for input(s): CKTOTAL, CKMB, CKMBINDEX, TROPONINI in the last 168 hours.  BNP (last 3 results) No results for  input(s): BNP in the last 8760 hours.  ProBNP (last 3 results) No results for input(s): PROBNP in the last 8760 hours.  Radiological Exams: No results found.  Assessment/Plan Active Problems:   Acute on chronic respiratory failure with hypoxia (HCC)   Acute stroke due to ischemia (HCC)   COPD, severe (HCC)   Morbid obesity (HCC)   1. Acute on chronic respiratory failure hypoxia patient continues to be capped on room air at this time satting well no fever or distress.  We will continue aggressive pulmonary toilet supportive measures. 2. Acute stroke no change continue present management 3. Severe COPD at baseline we'll continue with supportive care 4. Morbid obesity no change   I have personally seen and evaluated the patient, evaluated laboratory and imaging results, formulated the assessment and plan and placed orders. The Patient requires high complexity decision making with multiple systems involvement.  Rounds were done with the Respiratory Therapy Director and Staff therapists and discussed with nursing staff also.  09/08/20, MD [facility] Pulmonary Critical Care Medicine Sleep Medicine

## 2020-09-11 DIAGNOSIS — I639 Cerebral infarction, unspecified: Secondary | ICD-10-CM | POA: Diagnosis not present

## 2020-09-11 DIAGNOSIS — J9621 Acute and chronic respiratory failure with hypoxia: Secondary | ICD-10-CM | POA: Diagnosis not present

## 2020-09-11 DIAGNOSIS — J449 Chronic obstructive pulmonary disease, unspecified: Secondary | ICD-10-CM | POA: Diagnosis not present

## 2020-09-11 LAB — CBC
HCT: 45.1 % (ref 36.0–46.0)
Hemoglobin: 14.2 g/dL (ref 12.0–15.0)
MCH: 26.9 pg (ref 26.0–34.0)
MCHC: 31.5 g/dL (ref 30.0–36.0)
MCV: 85.4 fL (ref 80.0–100.0)
Platelets: 397 10*3/uL (ref 150–400)
RBC: 5.28 MIL/uL — ABNORMAL HIGH (ref 3.87–5.11)
RDW: 16.4 % — ABNORMAL HIGH (ref 11.5–15.5)
WBC: 15.1 10*3/uL — ABNORMAL HIGH (ref 4.0–10.5)
nRBC: 0 % (ref 0.0–0.2)

## 2020-09-11 LAB — RENAL FUNCTION PANEL
Albumin: 3.3 g/dL — ABNORMAL LOW (ref 3.5–5.0)
Anion gap: 13 (ref 5–15)
BUN: 17 mg/dL (ref 6–20)
CO2: 22 mmol/L (ref 22–32)
Calcium: 9.5 mg/dL (ref 8.9–10.3)
Chloride: 96 mmol/L — ABNORMAL LOW (ref 98–111)
Creatinine, Ser: 0.48 mg/dL (ref 0.44–1.00)
GFR, Estimated: >60 mL/min (ref >60)
Glucose, Bld: 121 mg/dL — ABNORMAL HIGH (ref 70–99)
Phosphorus: 5 mg/dL — ABNORMAL HIGH (ref 2.5–4.6)
Potassium: 4.2 mmol/L (ref 3.5–5.1)
Sodium: 131 mmol/L — ABNORMAL LOW (ref 135–145)

## 2020-09-11 LAB — MAGNESIUM: Magnesium: 1.9 mg/dL (ref 1.7–2.4)

## 2020-09-11 LAB — VANCOMYCIN, TROUGH: Vancomycin Tr: 14 ug/mL — ABNORMAL LOW (ref 15–20)

## 2020-09-11 LAB — SARS CORONAVIRUS 2 (TAT 6-24 HRS): SARS Coronavirus 2: NEGATIVE

## 2020-09-11 NOTE — Progress Notes (Addendum)
Pulmonary Critical Care Medicine [facility] [place]   PULMONARY CRITICAL CARE SERVICE  PROGRESS NOTE  Date of Service: 09/11/2020  Kristin Sampson  SWN:[id number]  DOB: 04/06/1966   DOA: 08/26/2020  Referring Physician: Carron Curie, MD  HPI: Kristin Sampson is a 54 y.o. female seen for follow up of Acute on Chronic Respiratory Failure.  Patient mains capped on room air at this time satting well no distress.  Medications: Reviewed on Rounds  Physical Exam:  Vitals: Pulse 89 respirations 18 BP 112/69 O2 sat 97% temp 96.9  Ventilator Settings not currently on ventilator  . General: Comfortable at this time . Eyes: Grossly normal lids, irises & conjunctiva . ENT: grossly tongue is normal . Neck: no obvious mass . Cardiovascular: S1 S2 normal no gallop . Respiratory: No rales or rhonchi noted . Abdomen: soft . Skin: no rash seen on limited exam . Musculoskeletal: not rigid . Psychiatric:unable to assess . Neurologic: no seizure no involuntary movements         Lab Data:   Basic Metabolic Panel: Recent Labs  Lab 09/06/20 0434 09/09/20 0708 09/10/20 2353  NA 134* 135 131*  K 4.4 4.7 4.2  CL 98 99 96*  CO2 26 23 22   GLUCOSE 163* 144* 121*  BUN 15 16 17   CREATININE 0.53 0.52 0.48  CALCIUM 9.7 9.8 9.5  MG 2.0 2.0 1.9  PHOS 4.6 5.3* 5.0*    ABG: No results for input(s): PHART, PCO2ART, PO2ART, HCO3, O2SAT in the last 168 hours.  Liver Function Tests: Recent Labs  Lab 09/10/20 2353  ALBUMIN 3.3*   No results for input(s): LIPASE, AMYLASE in the last 168 hours. No results for input(s): AMMONIA in the last 168 hours.  CBC: Recent Labs  Lab 09/06/20 0434 09/09/20 0708 09/10/20 2353  WBC 16.1* 16.1* 15.1*  HGB 13.5 14.2 14.2  HCT 42.5 44.9 45.1  MCV 85.5 83.9 85.4  PLT 442* 411* 397    Cardiac Enzymes: No results for input(s): CKTOTAL, CKMB, CKMBINDEX, TROPONINI in the last 168 hours.  BNP (last 3 results) No results for input(s): BNP in  the last 8760 hours.  ProBNP (last 3 results) No results for input(s): PROBNP in the last 8760 hours.  Radiological Exams: No results found.  Assessment/Plan Active Problems:   Acute on chronic respiratory failure with hypoxia (HCC)   Acute stroke due to ischemia (HCC)   COPD, severe (HCC)   Morbid obesity (HCC)   1. Acute on chronic respiratory failure hypoxia  patient continues to be capped on room air satting well we will continue supportive measures and pulmonary toilet. 2. Acute stroke no change continue present management 3. Severe COPD at baseline we'll continue with supportive care 4. Morbid obesity no change   I have personally seen and evaluated the patient, evaluated laboratory and imaging results, formulated the assessment and plan and placed orders. The Patient requires high complexity decision making with multiple systems involvement.  Rounds were done with the Respiratory Therapy Director and Staff therapists and discussed with nursing staff also.  09/11/20, MD [facility] Pulmonary Critical Care Medicine Sleep Medicine

## 2020-09-12 ENCOUNTER — Other Ambulatory Visit (HOSPITAL_COMMUNITY): Payer: Medicare HMO

## 2020-09-12 LAB — SODIUM: Sodium: 134 mmol/L — ABNORMAL LOW (ref 135–145)

## 2021-12-24 DEATH — deceased

## 2022-03-08 IMAGING — DX DG ABD PORTABLE 1V
2 series · 2 of 2 positions shown · non-contrast
Comparison: None.

CLINICAL DATA: Percutaneous gastrostomy tube verification

EXAM:
PORTABLE ABDOMEN - 1 VIEW

[abdomen kub (1 of 2)]
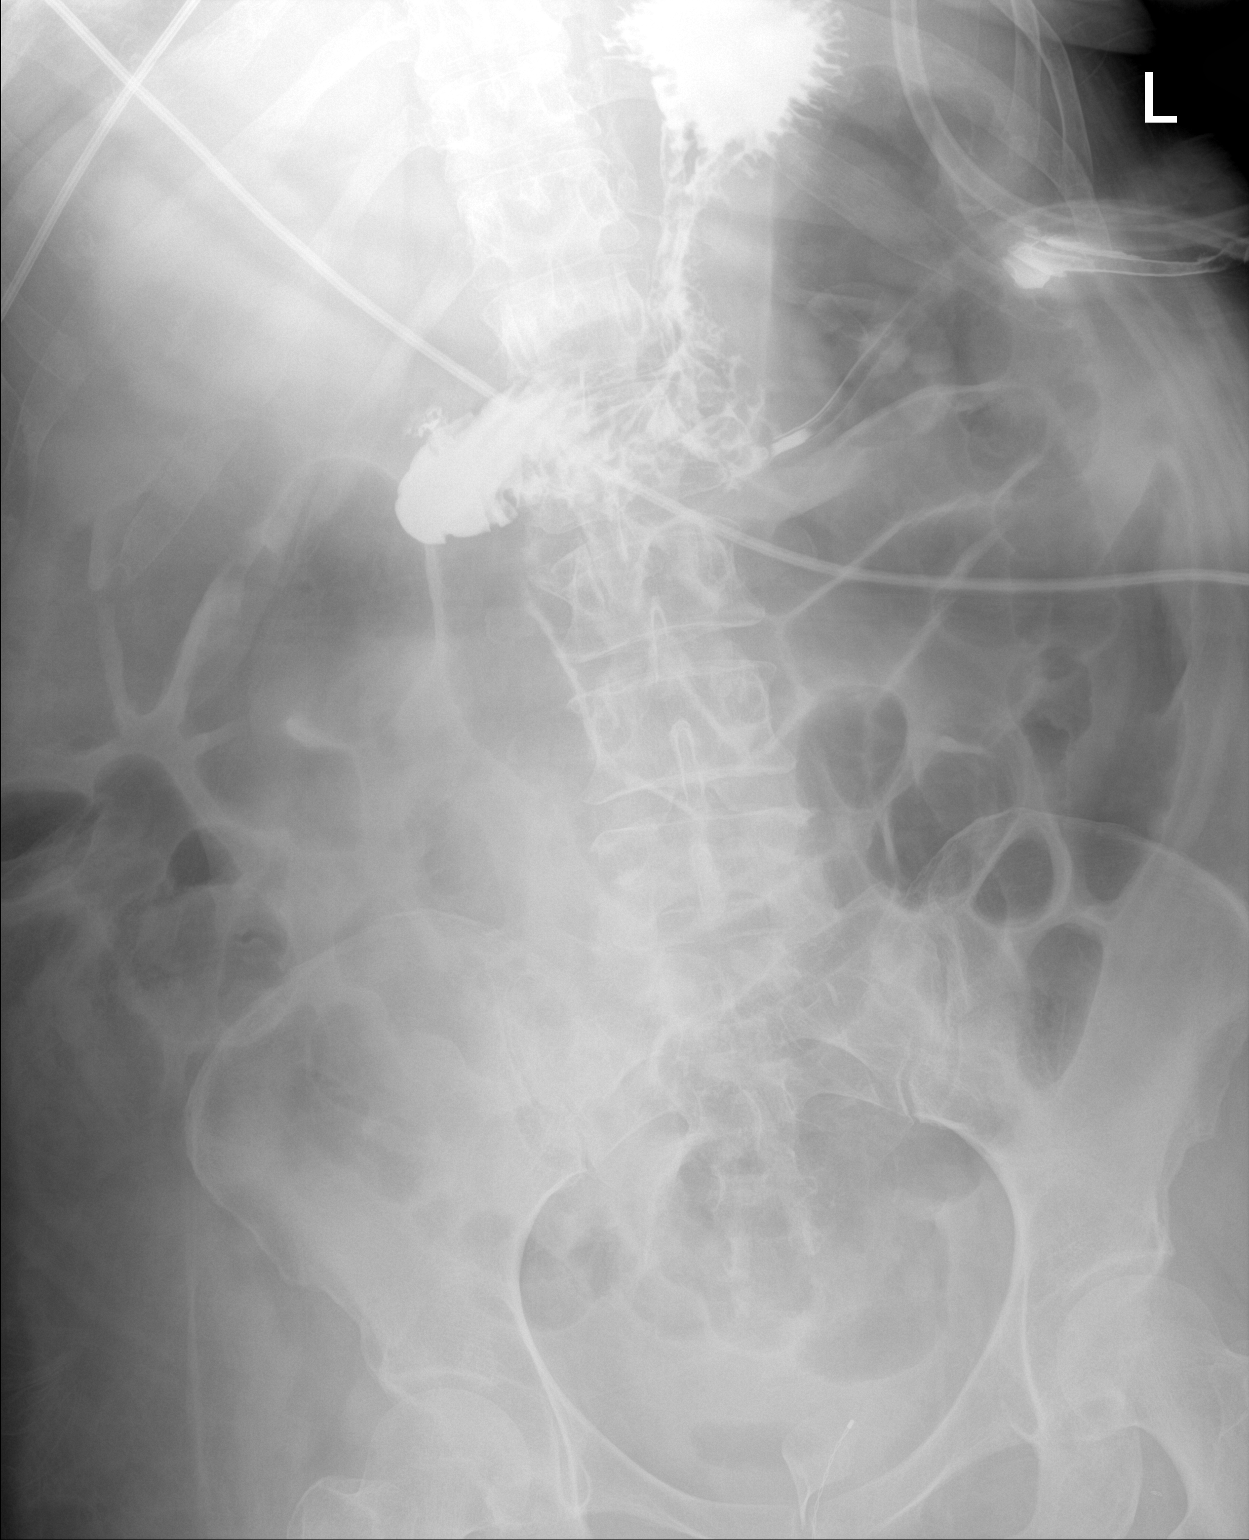

[abdomen kub (2 of 2)]
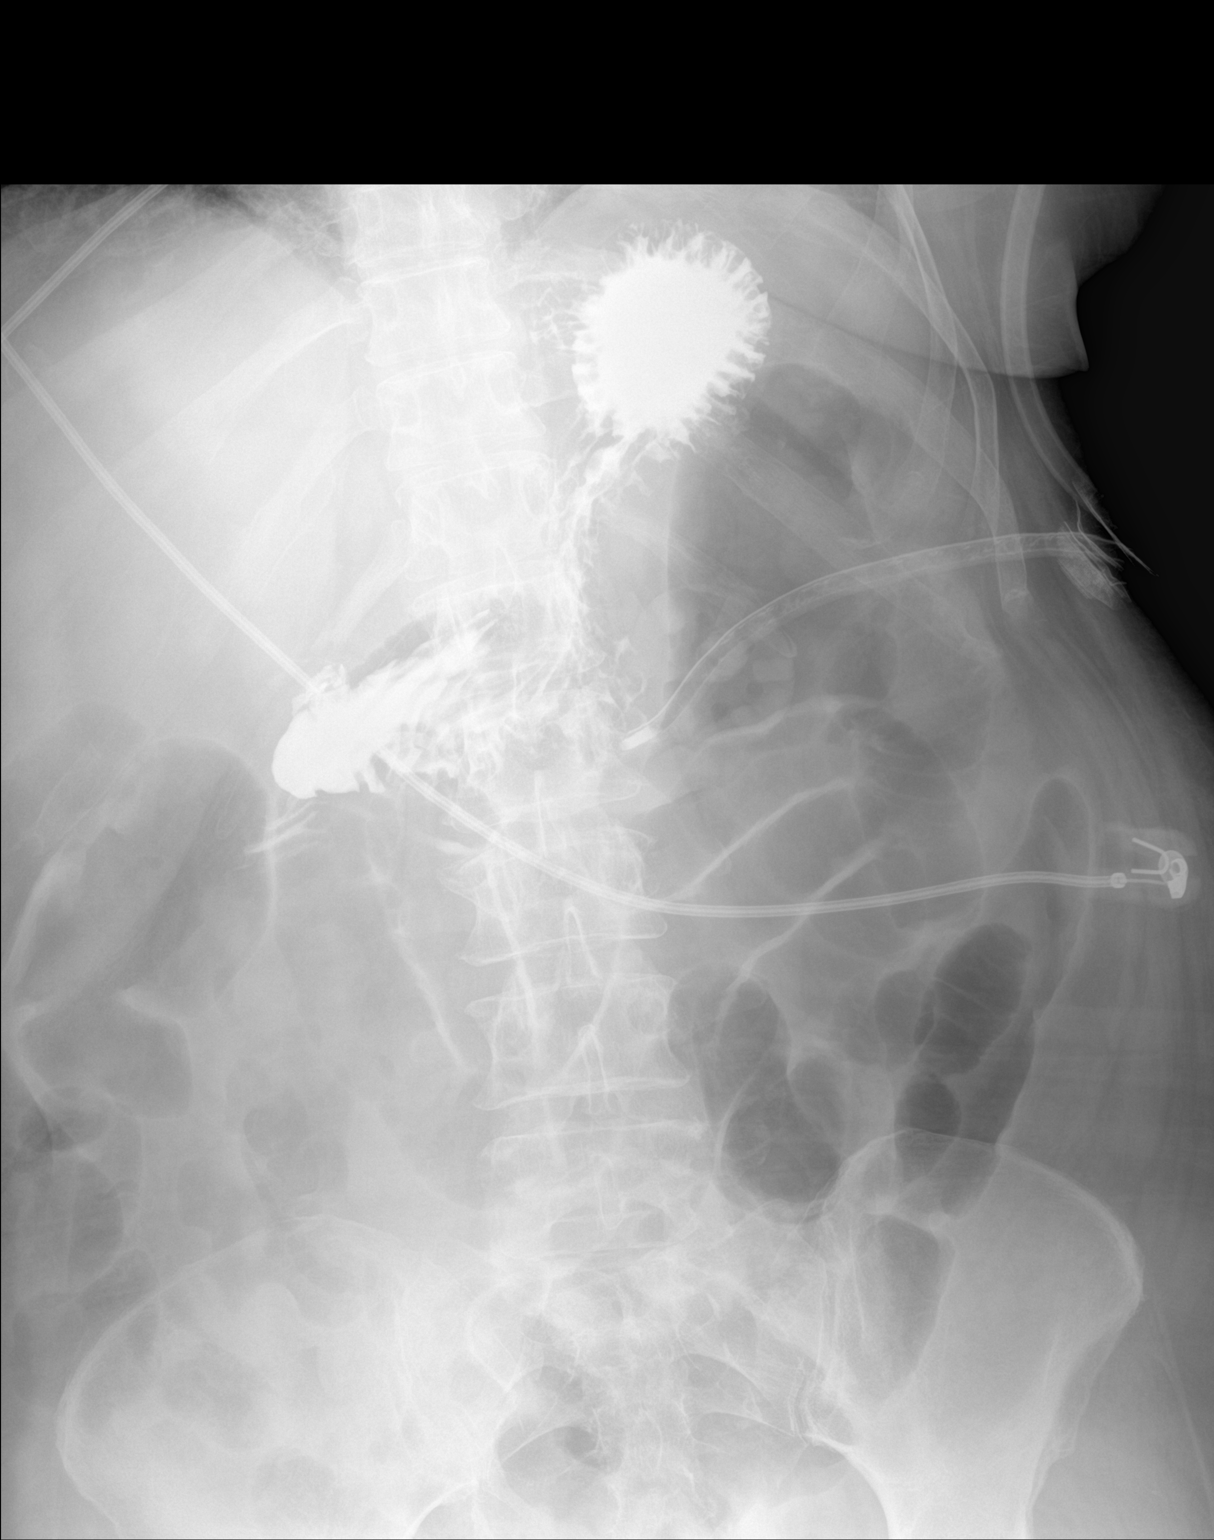

[2 of 2 positions shown; findings below may reference images not displayed]

FINDINGS: Injection of contrast through the pre-existing gastrostomy tube
opacifies the stomach. No extraluminal contrast was noted. The bowel
gas pattern is nonobstructive.
IMPRESSION: Gastrostomy tube appears grossly well positioned without evidence
for extraluminal contrast.

## 2022-03-16 IMAGING — DX DG CHEST 1V PORT
1 series · 1 of 1 positions shown · non-contrast
Comparison: Single-view of the chest 08/27/2020.

CLINICAL DATA: Acute on chronic respiratory failure.

EXAM:
PORTABLE CHEST 1 VIEW

[chest ap]
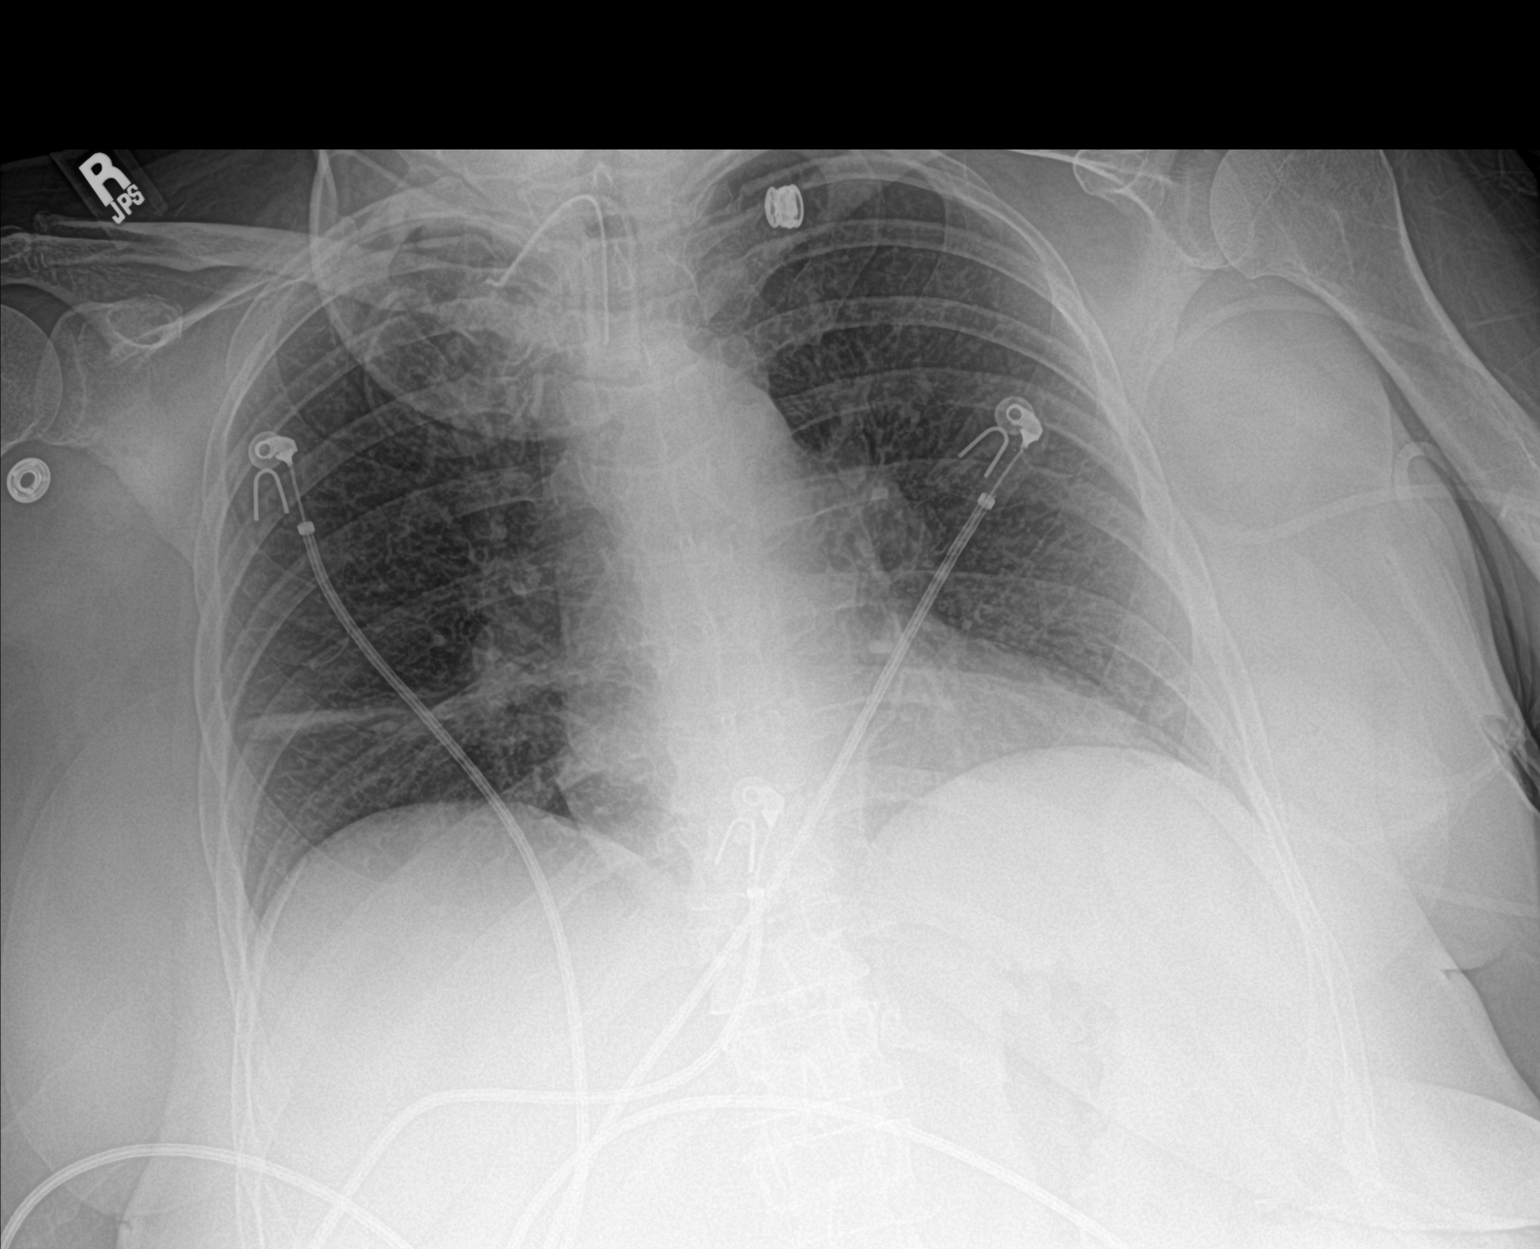

[1 of 1 positions shown; findings below may reference images not displayed]

FINDINGS: Right PICC has been removed. Tracheostomy tube remains in place and
projects in good position. Left basilar airspace disease has
markedly improved. Small focus of discoid atelectasis in the right
lung base noted. No pneumothorax or pleural effusion. Heart size is
normal.
IMPRESSION: Resolved left basilar airspace disease. Small focus of atelectasis
in the right lung base noted.

Status post removal of right PICC. Tracheostomy tube projects in
good position.
